# Patient Record
Sex: Male | Born: 1945 | Race: White | Hispanic: No | Marital: Married | Smoking: Never smoker
Health system: Southern US, Community
[De-identification: ages and names within clinical notes are randomized; demographics above are authoritative.]

## PROBLEM LIST (undated history)

## (undated) DIAGNOSIS — H269 Unspecified cataract: Secondary | ICD-10-CM

## (undated) DIAGNOSIS — K5790 Diverticulosis of intestine, part unspecified, without perforation or abscess without bleeding: Secondary | ICD-10-CM

## (undated) DIAGNOSIS — I10 Essential (primary) hypertension: Secondary | ICD-10-CM

## (undated) DIAGNOSIS — E785 Hyperlipidemia, unspecified: Secondary | ICD-10-CM

## (undated) DIAGNOSIS — H409 Unspecified glaucoma: Secondary | ICD-10-CM

## (undated) DIAGNOSIS — K649 Unspecified hemorrhoids: Secondary | ICD-10-CM

## (undated) DIAGNOSIS — E119 Type 2 diabetes mellitus without complications: Secondary | ICD-10-CM

## (undated) DIAGNOSIS — K219 Gastro-esophageal reflux disease without esophagitis: Secondary | ICD-10-CM

## (undated) DIAGNOSIS — N400 Enlarged prostate without lower urinary tract symptoms: Secondary | ICD-10-CM

## (undated) HISTORY — DX: Diverticulosis of intestine, part unspecified, without perforation or abscess without bleeding: K57.90

## (undated) HISTORY — DX: Unspecified hemorrhoids: K64.9

## (undated) HISTORY — DX: Type 2 diabetes mellitus without complications: E11.9

## (undated) HISTORY — DX: Benign prostatic hyperplasia without lower urinary tract symptoms: N40.0

## (undated) HISTORY — DX: Unspecified glaucoma: H40.9

## (undated) HISTORY — DX: Essential (primary) hypertension: I10

## (undated) HISTORY — PX: COLONOSCOPY: SHX174

## (undated) HISTORY — DX: Unspecified cataract: H26.9

## (undated) HISTORY — PX: POLYPECTOMY: SHX149

## (undated) HISTORY — DX: Hyperlipidemia, unspecified: E78.5

---

## 1994-11-16 DIAGNOSIS — I10 Essential (primary) hypertension: Secondary | ICD-10-CM | POA: Insufficient documentation

## 1994-11-16 DIAGNOSIS — N400 Enlarged prostate without lower urinary tract symptoms: Secondary | ICD-10-CM

## 1994-11-16 HISTORY — DX: Essential (primary) hypertension: I10

## 1994-11-16 HISTORY — DX: Benign prostatic hyperplasia without lower urinary tract symptoms: N40.0

## 1995-04-17 DIAGNOSIS — E785 Hyperlipidemia, unspecified: Secondary | ICD-10-CM

## 1995-04-17 HISTORY — DX: Hyperlipidemia, unspecified: E78.5

## 1998-06-16 ENCOUNTER — Encounter: Payer: Self-pay | Admitting: Family Medicine

## 1998-06-16 LAB — CONVERTED CEMR LAB: PSA: 1.6 ng/mL

## 1998-11-16 DIAGNOSIS — J301 Allergic rhinitis due to pollen: Secondary | ICD-10-CM

## 2001-01-14 DIAGNOSIS — K644 Residual hemorrhoidal skin tags: Secondary | ICD-10-CM

## 2001-03-16 ENCOUNTER — Encounter: Payer: Self-pay | Admitting: Family Medicine

## 2001-03-16 DIAGNOSIS — R739 Hyperglycemia, unspecified: Secondary | ICD-10-CM

## 2001-03-16 LAB — CONVERTED CEMR LAB: PSA: 0.6 ng/mL

## 2002-06-16 ENCOUNTER — Encounter: Payer: Self-pay | Admitting: Family Medicine

## 2002-06-16 LAB — CONVERTED CEMR LAB: PSA: 1.1 ng/mL

## 2004-01-15 ENCOUNTER — Encounter: Payer: Self-pay | Admitting: Family Medicine

## 2005-10-16 ENCOUNTER — Encounter: Payer: Self-pay | Admitting: Family Medicine

## 2005-10-22 ENCOUNTER — Ambulatory Visit: Payer: Self-pay | Admitting: Family Medicine

## 2005-11-11 ENCOUNTER — Ambulatory Visit: Payer: Self-pay | Admitting: Family Medicine

## 2005-11-26 ENCOUNTER — Ambulatory Visit: Payer: Self-pay | Admitting: Family Medicine

## 2006-02-10 ENCOUNTER — Ambulatory Visit: Payer: Self-pay | Admitting: Family Medicine

## 2006-06-28 ENCOUNTER — Ambulatory Visit: Payer: Self-pay | Admitting: Unknown Physician Specialty

## 2006-06-28 DIAGNOSIS — K5730 Diverticulosis of large intestine without perforation or abscess without bleeding: Secondary | ICD-10-CM | POA: Insufficient documentation

## 2006-12-22 ENCOUNTER — Ambulatory Visit: Payer: Self-pay | Admitting: Family Medicine

## 2007-02-16 ENCOUNTER — Ambulatory Visit: Payer: Self-pay | Admitting: Family Medicine

## 2007-02-16 LAB — CONVERTED CEMR LAB
AST: 22 units/L (ref 0–37)
Albumin: 4 g/dL (ref 3.5–5.2)
Alkaline Phosphatase: 47 units/L (ref 39–117)
BUN: 12 mg/dL (ref 6–23)
Calcium: 9.3 mg/dL (ref 8.4–10.5)
Chloride: 108 meq/L (ref 96–112)
Creatinine,U: 227.7 mg/dL
Direct LDL: 154.8 mg/dL
GFR calc non Af Amer: 66 mL/min
PSA: 1.46 ng/mL (ref 0.10–4.00)
Sodium: 142 meq/L (ref 135–145)
Total CHOL/HDL Ratio: 4.1
VLDL: 23 mg/dL (ref 0–40)

## 2007-02-18 ENCOUNTER — Ambulatory Visit: Payer: Self-pay | Admitting: Family Medicine

## 2007-08-22 ENCOUNTER — Ambulatory Visit: Payer: Self-pay | Admitting: Family Medicine

## 2007-08-22 LAB — CONVERTED CEMR LAB
ALT: 20 units/L (ref 0–53)
HDL: 56.9 mg/dL (ref >39.0)
PSA: 1.45 ng/mL (ref 0.10–4.00)
VLDL: 21 mg/dL (ref 0–40)

## 2007-08-24 ENCOUNTER — Ambulatory Visit: Payer: Self-pay | Admitting: Family Medicine

## 2008-03-29 ENCOUNTER — Ambulatory Visit: Payer: Self-pay | Admitting: Family Medicine

## 2008-03-29 LAB — CONVERTED CEMR LAB
Basophils Absolute: 0.1 10*3/uL (ref 0.0–0.1)
Bilirubin, Direct: 0.1 mg/dL (ref 0.0–0.3)
Calcium: 9 mg/dL (ref 8.4–10.5)
Cholesterol: 171 mg/dL (ref 0–200)
Eosinophils Absolute: 0.3 10*3/uL (ref 0.0–0.7)
GFR calc Af Amer: 111 mL/min
HCT: 32.2 % — ABNORMAL LOW (ref 39.0–52.0)
Hemoglobin: 10 g/dL — ABNORMAL LOW (ref 13.0–17.0)
MCHC: 31.2 g/dL (ref 30.0–36.0)
Microalb Creat Ratio: 38.6 mg/g — ABNORMAL HIGH (ref 0.0–30.0)
Microalb, Ur: 5.8 mg/dL — ABNORMAL HIGH (ref 0.0–1.9)
Monocytes Absolute: 0.6 10*3/uL (ref 0.1–1.0)
Neutro Abs: 3.3 10*3/uL (ref 1.4–7.7)
PSA: 1.13 ng/mL (ref 0.10–4.00)
RDW: 17.3 % — ABNORMAL HIGH (ref 11.5–14.6)
Sodium: 143 meq/L (ref 135–145)
TSH: 0.61 microintl units/mL (ref 0.35–5.50)
Total Bilirubin: 0.5 mg/dL (ref 0.3–1.2)
VLDL: 45 mg/dL — ABNORMAL HIGH (ref 0–40)

## 2008-04-02 ENCOUNTER — Ambulatory Visit: Payer: Self-pay | Admitting: Family Medicine

## 2008-04-02 DIAGNOSIS — D509 Iron deficiency anemia, unspecified: Secondary | ICD-10-CM | POA: Insufficient documentation

## 2008-04-02 DIAGNOSIS — N529 Male erectile dysfunction, unspecified: Secondary | ICD-10-CM

## 2008-06-08 ENCOUNTER — Ambulatory Visit: Payer: Self-pay | Admitting: Family Medicine

## 2008-06-08 LAB — FECAL OCCULT BLOOD, GUAIAC: Fecal Occult Blood: NEGATIVE

## 2008-06-11 ENCOUNTER — Encounter (INDEPENDENT_AMBULATORY_CARE_PROVIDER_SITE_OTHER): Payer: Self-pay | Admitting: *Deleted

## 2008-09-04 ENCOUNTER — Ambulatory Visit: Payer: Self-pay | Admitting: Family Medicine

## 2009-10-21 ENCOUNTER — Ambulatory Visit: Payer: Self-pay | Admitting: Family Medicine

## 2009-10-21 LAB — CONVERTED CEMR LAB
ALT: 34 units/L (ref 0–53)
Albumin: 4.1 g/dL (ref 3.5–5.2)
Alkaline Phosphatase: 53 units/L (ref 39–117)
Basophils Relative: 1 % (ref 0.0–3.0)
CO2: 28 meq/L (ref 19–32)
Calcium: 9.2 mg/dL (ref 8.4–10.5)
Creatinine,U: 103.1 mg/dL
Eosinophils Relative: 7 % — ABNORMAL HIGH (ref 0.0–5.0)
Glucose, Bld: 111 mg/dL — ABNORMAL HIGH (ref 70–99)
HCT: 44.1 % (ref 39.0–52.0)
HDL: 52.3 mg/dL (ref >39.00)
Hemoglobin: 15.1 g/dL (ref 13.0–17.0)
Lymphs Abs: 1.3 10*3/uL (ref 0.7–4.0)
Microalb Creat Ratio: 23.3 mg/g (ref 0.0–30.0)
Microalb, Ur: 2.4 mg/dL — ABNORMAL HIGH (ref 0.0–1.9)
Monocytes Relative: 10 % (ref 3.0–12.0)
Neutro Abs: 3.2 10*3/uL (ref 1.4–7.7)
PSA: 1.05 ng/mL (ref 0.10–4.00)
RBC: 4.35 M/uL (ref 4.22–5.81)
RDW: 12.4 % (ref 11.5–14.6)
Sodium: 140 meq/L (ref 135–145)
TSH: 1.2 microintl units/mL (ref 0.35–5.50)
Total Protein: 6.9 g/dL (ref 6.0–8.3)
WBC: 5.6 10*3/uL (ref 4.5–10.5)

## 2009-10-23 ENCOUNTER — Ambulatory Visit: Payer: Self-pay | Admitting: Family Medicine

## 2009-11-06 ENCOUNTER — Ambulatory Visit: Payer: Self-pay | Admitting: Family Medicine

## 2010-06-24 ENCOUNTER — Encounter (INDEPENDENT_AMBULATORY_CARE_PROVIDER_SITE_OTHER): Payer: Self-pay | Admitting: *Deleted

## 2010-12-17 NOTE — Letter (Signed)
Summary: Raymond Kirby letter  [place] at [facility]  [address] [place], [place] [address]   Phone: [phone]  Fax: [phone]       06/24/2010 MRN: [id number]  Raymond Kirby [address] [place], [place]  [address]  Dear Mr. MARSALIS,  [facility] [place], and [facility] announce the retirement of Raymond Kirby, M.D., from full-time practice at the [facility] office effective May 15, 2010 and his plans of returning part-time.  It is important to Raymond Kirby and to our practice that you understand that [facility] has seven physicians in our office for your health care needs.  We will continue to offer the same exceptional care that you have today.    Raymond Kirby has spoken to many of you about his plans for retirement and returning part-time in the fall.   We will continue to work with you through the transition to schedule appointments for you in the office and meet the high standards that [place] is committed to.   Again, it is with great pleasure that we share the news that Raymond Kirby will return to [facility] at [facility] in October of 2011 with a reduced schedule.    If you have any questions, or would like to request an appointment with one of our physicians, please call us at [phone] and press the option for Scheduling an appointment.  We take pleasure in providing you with excellent patient care and look forward to seeing you at your next office visit.  Our [facility] Physicians are:  Raymond Kirby, M.D. Raymond Kirby, M.D. Raymond Kirby, M.D. Raymond Kirby, M.D. Raymond Kirby, M.D. Raymond Kirby, M.D. We proudly welcomed Raymond Kirby, M.D. and Raymond Kirby, M.D. to the practice in July/August 2011.  Sincerely,   [facility]

## 2010-12-23 ENCOUNTER — Encounter: Payer: Self-pay | Admitting: Family Medicine

## 2011-02-04 ENCOUNTER — Other Ambulatory Visit: Payer: Self-pay

## 2011-02-06 ENCOUNTER — Other Ambulatory Visit: Payer: Self-pay | Admitting: Family Medicine

## 2011-02-06 DIAGNOSIS — D508 Other iron deficiency anemias: Secondary | ICD-10-CM

## 2011-02-06 DIAGNOSIS — N400 Enlarged prostate without lower urinary tract symptoms: Secondary | ICD-10-CM

## 2011-02-06 DIAGNOSIS — I10 Essential (primary) hypertension: Secondary | ICD-10-CM

## 2011-02-06 DIAGNOSIS — R7989 Other specified abnormal findings of blood chemistry: Secondary | ICD-10-CM

## 2011-02-06 DIAGNOSIS — E785 Hyperlipidemia, unspecified: Secondary | ICD-10-CM

## 2011-02-09 ENCOUNTER — Other Ambulatory Visit (INDEPENDENT_AMBULATORY_CARE_PROVIDER_SITE_OTHER): Payer: BC Managed Care – PPO | Admitting: Family Medicine

## 2011-02-09 DIAGNOSIS — R7989 Other specified abnormal findings of blood chemistry: Secondary | ICD-10-CM

## 2011-02-09 DIAGNOSIS — N400 Enlarged prostate without lower urinary tract symptoms: Secondary | ICD-10-CM

## 2011-02-09 DIAGNOSIS — D508 Other iron deficiency anemias: Secondary | ICD-10-CM

## 2011-02-09 DIAGNOSIS — E785 Hyperlipidemia, unspecified: Secondary | ICD-10-CM

## 2011-02-09 DIAGNOSIS — I10 Essential (primary) hypertension: Secondary | ICD-10-CM

## 2011-02-09 LAB — CBC WITH DIFFERENTIAL/PLATELET
Basophils Relative: 0.7 % (ref 0.0–3.0)
HCT: 43.1 % (ref 39.0–52.0)
Hemoglobin: 14.9 g/dL (ref 13.0–17.0)
Lymphocytes Relative: 19 % (ref 12.0–46.0)
MCHC: 34.6 g/dL (ref 30.0–36.0)
Monocytes Relative: 9.5 % (ref 3.0–12.0)
Neutro Abs: 3.8 10*3/uL (ref 1.4–7.7)
RBC: 4.37 Mil/uL (ref 4.22–5.81)

## 2011-02-09 LAB — HEPATIC FUNCTION PANEL
ALT: 39 U/L (ref 0–53)
Albumin: 4.3 g/dL (ref 3.5–5.2)
Bilirubin, Direct: 0.1 mg/dL (ref 0.0–0.3)
Total Protein: 7.1 g/dL (ref 6.0–8.3)

## 2011-02-09 LAB — BASIC METABOLIC PANEL
Calcium: 9.4 mg/dL (ref 8.4–10.5)
GFR: 95.35 mL/min (ref >60.00)
Sodium: 137 mEq/L (ref 135–145)

## 2011-02-09 LAB — MICROALBUMIN / CREATININE URINE RATIO
Creatinine,U: 289.1 mg/dL
Microalb Creat Ratio: 4.3 mg/g (ref 0.0–30.0)

## 2011-02-09 LAB — TSH: TSH: 1.11 u[IU]/mL (ref 0.35–5.50)

## 2011-02-09 LAB — LIPID PANEL: Triglycerides: 217 mg/dL — ABNORMAL HIGH (ref 0.0–149.0)

## 2011-02-09 LAB — PSA: PSA: 0.78 ng/mL (ref 0.10–4.00)

## 2011-02-11 ENCOUNTER — Ambulatory Visit (INDEPENDENT_AMBULATORY_CARE_PROVIDER_SITE_OTHER): Payer: BC Managed Care – PPO | Admitting: Family Medicine

## 2011-02-11 ENCOUNTER — Encounter: Payer: Self-pay | Admitting: Family Medicine

## 2011-02-11 DIAGNOSIS — N400 Enlarged prostate without lower urinary tract symptoms: Secondary | ICD-10-CM

## 2011-02-11 DIAGNOSIS — I10 Essential (primary) hypertension: Secondary | ICD-10-CM

## 2011-02-11 DIAGNOSIS — K645 Perianal venous thrombosis: Secondary | ICD-10-CM

## 2011-02-11 DIAGNOSIS — D508 Other iron deficiency anemias: Secondary | ICD-10-CM

## 2011-02-11 DIAGNOSIS — N529 Male erectile dysfunction, unspecified: Secondary | ICD-10-CM

## 2011-02-11 DIAGNOSIS — L309 Dermatitis, unspecified: Secondary | ICD-10-CM

## 2011-02-11 DIAGNOSIS — K219 Gastro-esophageal reflux disease without esophagitis: Secondary | ICD-10-CM

## 2011-02-11 DIAGNOSIS — R7989 Other specified abnormal findings of blood chemistry: Secondary | ICD-10-CM

## 2011-02-11 DIAGNOSIS — L259 Unspecified contact dermatitis, unspecified cause: Secondary | ICD-10-CM

## 2011-02-11 DIAGNOSIS — E785 Hyperlipidemia, unspecified: Secondary | ICD-10-CM

## 2011-02-11 MED ORDER — FINASTERIDE 5 MG PO TABS: 5.0000 mg | ORAL_TABLET | Freq: Every day | ORAL | Status: DC

## 2011-02-11 MED ORDER — OMEPRAZOLE 20 MG PO CPDR: 20.0000 mg | DELAYED_RELEASE_CAPSULE | Freq: Every day | ORAL | Status: DC

## 2011-02-11 MED ORDER — HYDROCORTISONE ACETATE 25 MG RE SUPP: 25.0000 mg | Freq: Two times a day (BID) | RECTAL | Status: DC

## 2011-02-11 MED ORDER — METOPROLOL SUCCINATE ER 50 MG PO TB24: 100.0000 mg | ORAL_TABLET | Freq: Every day | ORAL | Status: DC

## 2011-02-11 MED ORDER — ATORVASTATIN CALCIUM 80 MG PO TABS: 80.0000 mg | ORAL_TABLET | Freq: Every day | ORAL | Status: DC

## 2011-02-11 MED ORDER — CLOBETASOL PROP EMOLLIENT BASE 0.05 % EX CREA: TOPICAL_CREAM | CUTANEOUS | Status: DC

## 2011-02-11 NOTE — Patient Instructions (Signed)
RTC 3 mos for BP check.

## 2011-02-11 NOTE — Assessment & Plan Note (Addendum)
High. Will increase Metoprolol and recheck in about three months. BP Readings from Last 3 Encounters:  02/11/11 158/90  11/06/09 130/84  10/23/09 148/80

## 2011-02-11 NOTE — Assessment & Plan Note (Signed)
Viagra too expensive. Is dealing with sxs.

## 2011-02-11 NOTE — Assessment & Plan Note (Signed)
Improved

## 2011-02-11 NOTE — Assessment & Plan Note (Signed)
Sugar still slightly high. Discussed avoiding sweets and carbs and losing weight.

## 2011-02-11 NOTE — Assessment & Plan Note (Signed)
Is satisfied with current results with Proscar. Cont.

## 2011-02-11 NOTE — Assessment & Plan Note (Signed)
Distended today. Try Anusol HC Supps and use sitz bathing. Discussed cleaning with water after each BM as well.

## 2011-02-11 NOTE — Progress Notes (Signed)
  Subjective:    Patient ID: Raymond Kirby, male    DOB: Nov 09, 1946, 65 y.o.   MRN: [id number]  HPI Pt here for Comp Exam. His hemms still bother him. He takes fiber well. Otherwise he is doing well.    Review of Systems  Constitutional: Negative for fever, chills, diaphoresis, appetite change, fatigue and unexpected weight change.  HENT: Negative for hearing loss, ear pain, tinnitus and ear discharge.   Eyes: Negative for pain, discharge and visual disturbance.  Respiratory: Positive for cough (occas, poss due to GERD.). Negative for shortness of breath and wheezing.   Cardiovascular: Negative for chest pain and palpitations.       [No SOB w/ exertion Gastrointestinal: Positive for constipation and blood in stool. Negative for nausea, vomiting, abdominal pain and diarrhea.       [Occas  Heartburn, no  swallowing problems. Genitourinary: Negative for dysuria, frequency and difficulty urinating.       [No nocturia Musculoskeletal: Negative for myalgias, back pain and arthralgias.  Skin: Rash: of lower legs.        [No itching or dryness. Neurological: Negative for tremors and numbness.       [No tingling or balance problems. Hematological: Negative for adenopathy. Does not bruise/bleed easily.  Psychiatric/Behavioral: Negative for dysphoric mood and agitation.       Objective:   Physical Exam  Constitutional: He is oriented to person, place, and time. He appears well-developed and well-nourished. No distress.  HENT:  Head: Normocephalic and atraumatic.  Right Ear: External ear normal.  Left Ear: External ear normal.  Nose: Nose normal.  Mouth/Throat: Oropharynx is clear and moist.  Eyes: Conjunctivae and EOM are normal. Pupils are equal, round, and reactive to light. Right eye exhibits no discharge. Left eye exhibits no discharge. No scleral icterus.  Neck: Normal range of motion. Neck supple. No thyromegaly present.  Cardiovascular: Normal rate, regular rhythm, normal heart  sounds and intact distal pulses.   No murmur heard. Pulmonary/Chest: Effort normal and breath sounds normal. No respiratory distress. He has no wheezes.  Abdominal: Soft. Bowel sounds are normal. He exhibits no distension and no mass. There is no tenderness. There is no rebound and no guarding.  Genitourinary: Prostate normal and penis normal. Guaiac positive stool.       Mildly distended left sided ext hemms with mild blood on rectal exam. Prostate 30gms.   Musculoskeletal: Normal range of motion. He exhibits no edema.  Lymphadenopathy:    He has no cervical adenopathy.  Neurological: He is alert and oriented to person, place, and time. Coordination normal.  Skin: Skin is warm and dry. No rash noted. He is not diaphoretic.       Mildly erythematous maculopapular rash around the ankles, bilat. 3 distinct small bites of the right lateral ankle, look clean and healing.  Psychiatric: He has a normal mood and affect. His behavior is normal. Judgment and thought content normal.          Assessment & Plan:  Physical Exam

## 2011-02-11 NOTE — Assessment & Plan Note (Signed)
Acceptable but try to decrease Sweets and carbs and fatty foods. Weight loss will help.

## 2012-02-19 ENCOUNTER — Other Ambulatory Visit: Payer: Self-pay | Admitting: *Deleted

## 2012-02-19 DIAGNOSIS — N400 Enlarged prostate without lower urinary tract symptoms: Secondary | ICD-10-CM

## 2012-02-19 DIAGNOSIS — E785 Hyperlipidemia, unspecified: Secondary | ICD-10-CM

## 2012-02-19 MED ORDER — FINASTERIDE 5 MG PO TABS: 5.0000 mg | ORAL_TABLET | Freq: Every day | ORAL | Status: DC

## 2012-02-19 MED ORDER — ATORVASTATIN CALCIUM 80 MG PO TABS: 80.0000 mg | ORAL_TABLET | Freq: Every day | ORAL | Status: DC

## 2012-02-19 MED ORDER — METOPROLOL SUCCINATE ER 50 MG PO TB24: 100.0000 mg | ORAL_TABLET | Freq: Every day | ORAL | Status: DC

## 2012-02-19 NOTE — Telephone Encounter (Signed)
Ok to refill?  Patient has an upcoming appt with Dr. Para March.

## 2012-02-19 NOTE — Telephone Encounter (Signed)
Sent!

## 2012-05-01 ENCOUNTER — Other Ambulatory Visit: Payer: Self-pay | Admitting: Family Medicine

## 2012-05-01 DIAGNOSIS — D649 Anemia, unspecified: Secondary | ICD-10-CM

## 2012-05-01 DIAGNOSIS — N400 Enlarged prostate without lower urinary tract symptoms: Secondary | ICD-10-CM

## 2012-05-01 DIAGNOSIS — I10 Essential (primary) hypertension: Secondary | ICD-10-CM

## 2012-05-04 ENCOUNTER — Other Ambulatory Visit: Payer: BC Managed Care – PPO

## 2012-05-10 ENCOUNTER — Other Ambulatory Visit (INDEPENDENT_AMBULATORY_CARE_PROVIDER_SITE_OTHER): Payer: BC Managed Care – PPO

## 2012-05-10 DIAGNOSIS — D649 Anemia, unspecified: Secondary | ICD-10-CM

## 2012-05-10 DIAGNOSIS — I10 Essential (primary) hypertension: Secondary | ICD-10-CM

## 2012-05-10 DIAGNOSIS — N400 Enlarged prostate without lower urinary tract symptoms: Secondary | ICD-10-CM

## 2012-05-10 LAB — CBC WITH DIFFERENTIAL/PLATELET
Basophils Absolute: 0.1 10*3/uL (ref 0.0–0.1)
Eosinophils Relative: 6.2 % — ABNORMAL HIGH (ref 0.0–5.0)
Lymphocytes Relative: 20.2 % (ref 12.0–46.0)
Monocytes Relative: 11.3 % (ref 3.0–12.0)
Platelets: 217 10*3/uL (ref 150.0–400.0)
RDW: 17.1 % — ABNORMAL HIGH (ref 11.5–14.6)
WBC: 6.5 10*3/uL (ref 4.5–10.5)

## 2012-05-10 LAB — COMPREHENSIVE METABOLIC PANEL
ALT: 35 U/L (ref 0–53)
Albumin: 4.1 g/dL (ref 3.5–5.2)
CO2: 27 mEq/L (ref 19–32)
Calcium: 9.9 mg/dL (ref 8.4–10.5)
Chloride: 102 mEq/L (ref 96–112)
GFR: 81.68 mL/min (ref >60.00)
Glucose, Bld: 113 mg/dL — ABNORMAL HIGH (ref 70–99)
Potassium: 4.2 mEq/L (ref 3.5–5.1)
Sodium: 141 mEq/L (ref 135–145)
Total Protein: 7 g/dL (ref 6.0–8.3)

## 2012-05-10 LAB — LIPID PANEL
HDL: 56.8 mg/dL (ref >39.00)
LDL Cholesterol: 85 mg/dL (ref 0–99)
Total CHOL/HDL Ratio: 3
Triglycerides: 134 mg/dL (ref 0.0–149.0)

## 2012-05-11 ENCOUNTER — Encounter: Payer: BC Managed Care – PPO | Admitting: Family Medicine

## 2012-05-12 ENCOUNTER — Encounter: Payer: Self-pay | Admitting: Family Medicine

## 2012-05-12 ENCOUNTER — Ambulatory Visit (INDEPENDENT_AMBULATORY_CARE_PROVIDER_SITE_OTHER): Payer: BC Managed Care – PPO | Admitting: Family Medicine

## 2012-05-12 VITALS — BP 170/92 | HR 63 | Temp 98.4°F | Wt 212.0 lb

## 2012-05-12 DIAGNOSIS — Z0000 Encounter for general adult medical examination without abnormal findings: Secondary | ICD-10-CM | POA: Insufficient documentation

## 2012-05-12 DIAGNOSIS — E785 Hyperlipidemia, unspecified: Secondary | ICD-10-CM

## 2012-05-12 DIAGNOSIS — K644 Residual hemorrhoidal skin tags: Secondary | ICD-10-CM

## 2012-05-12 DIAGNOSIS — R7989 Other specified abnormal findings of blood chemistry: Secondary | ICD-10-CM

## 2012-05-12 DIAGNOSIS — N400 Enlarged prostate without lower urinary tract symptoms: Secondary | ICD-10-CM

## 2012-05-12 DIAGNOSIS — K645 Perianal venous thrombosis: Secondary | ICD-10-CM

## 2012-05-12 DIAGNOSIS — I10 Essential (primary) hypertension: Secondary | ICD-10-CM

## 2012-05-12 DIAGNOSIS — K219 Gastro-esophageal reflux disease without esophagitis: Secondary | ICD-10-CM

## 2012-05-12 MED ORDER — OMEPRAZOLE 20 MG PO CPDR: 20.0000 mg | DELAYED_RELEASE_CAPSULE | Freq: Every day | ORAL | Status: DC

## 2012-05-12 MED ORDER — ATORVASTATIN CALCIUM 80 MG PO TABS: 80.0000 mg | ORAL_TABLET | Freq: Every day | ORAL | Status: DC

## 2012-05-12 MED ORDER — METOPROLOL SUCCINATE ER 100 MG PO TB24: 100.0000 mg | ORAL_TABLET | Freq: Every day | ORAL | Status: DC

## 2012-05-12 MED ORDER — FINASTERIDE 5 MG PO TABS: 5.0000 mg | ORAL_TABLET | Freq: Every day | ORAL | Status: DC

## 2012-05-12 MED ORDER — HYDROCORTISONE ACETATE 25 MG RE SUPP: 25.0000 mg | Freq: Two times a day (BID) | RECTAL | Status: DC

## 2012-05-12 NOTE — Assessment & Plan Note (Signed)
anusol prn 

## 2012-05-12 NOTE — Assessment & Plan Note (Signed)
He'll check BP out of clinic, notify me with results.

## 2012-05-12 NOTE — Patient Instructions (Addendum)
Check your pressure at home and let me know if it is >140/90 consistently.  I would get a flu shot each fall.   Take care.  Work on your [employer].

## 2012-05-12 NOTE — Assessment & Plan Note (Signed)
D/w pt about weight loss. Recheck yearly.  Diet handout given.

## 2012-05-12 NOTE — Assessment & Plan Note (Signed)
PSA not elevated, continue current meds.

## 2012-05-12 NOTE — Assessment & Plan Note (Signed)
Controlled, continue current meds, needs to lose weight.

## 2012-05-12 NOTE — Progress Notes (Signed)
Patient requests new Rx for Anusol HC to use prn.  L. Anjela Cassara, CMA  CPE- See plan.  Routine anticipatory guidance given to patient.  See health maintenance. Colonoscopy 2007 PSA normal.  Shingles shot 2009 tetanus 2006 Flu shot done yearly PNA due at 66 Has a living will.    BPH.  On finasteride.  Stream is 'fair'.  PSA wnl.  No pain with urination.    Mild hyperglycemia. D/w pt.  See plan .  Hypertension:    Using medication without problems or lightheadedness: yes Chest pain with exertion:no Edema:no Short of breath:no Average home BPs: not checked.  He'll check out of clinic.   Elevated Cholesterol: Using medications without problems:yes Muscle aches: no Diet compliance: "not bad", discussed.  Exercise: "not much", discussed  PMH and SH reviewed  Meds, vitals, and allergies reviewed.   ROS: See HPI.  Otherwise negative.    GEN: nad, alert and oriented HEENT: mucous membranes moist NECK: supple w/o LA CV: rrr. PULM: ctab, no inc wob ABD: soft, +bs EXT: no edema SKIN: no acute rash Prostate gland firm and smooth, symmetric enlargement, but no nodularity, tenderness, mass, asymmetry or induration. Ext hemorrhoids noted.

## 2012-08-18 ENCOUNTER — Telehealth: Payer: Self-pay

## 2012-08-18 NOTE — Telephone Encounter (Signed)
Pt has periodically monitored BP at home ranging 130-150 / 70-80's. On 08/16/12 pt took BP 170/92 rechecked 133/84 and 5 min later recked 113/86. Not sure if BP device is accurate. Pt wants to know if needs to come in for appt to change BP med or can med be called to [facility]. Pt has had no h/a, dizziness or chest pain.Please advise.

## 2012-08-18 NOTE — Telephone Encounter (Signed)
Patient advised.

## 2012-08-18 NOTE — Telephone Encounter (Signed)
Have him calibrate his BP cuff against another cuff.  If readings are similar, then his cuff should be fine.  If that is the case, and with a majority of his BP readings being acceptable, then I wouldn't change his meds.  Thanks.

## 2012-09-30 ENCOUNTER — Encounter: Payer: Self-pay | Admitting: Family Medicine

## 2012-09-30 ENCOUNTER — Ambulatory Visit (INDEPENDENT_AMBULATORY_CARE_PROVIDER_SITE_OTHER): Payer: Medicare Other | Admitting: Family Medicine

## 2012-09-30 VITALS — BP 160/80 | HR 93 | Temp 99.4°F | Wt 213.0 lb

## 2012-09-30 DIAGNOSIS — R05 Cough: Secondary | ICD-10-CM

## 2012-09-30 MED ORDER — HYDROCODONE-HOMATROPINE 5-1.5 MG/5ML PO SYRP: 5.0000 mL | ORAL_SOLUTION | Freq: Three times a day (TID) | ORAL | Status: DC | PRN

## 2012-09-30 NOTE — Patient Instructions (Addendum)
Use the cough medicine as needed, sedation caution.  Drink plenty of fluids, take tylenol as needed, and gargle with warm salt water for your throat.  This should gradually improve.  Take care.  Let us know if you have other concerns.

## 2012-09-30 NOTE — Progress Notes (Signed)
2 days of symptoms.  Started taking guaifenesin and dextromethorphan w/o much relief.  Sputum production.  occ wheeze, prev.  Cough worse laying down at night.  Taking tylenol.  Temp 99.4 here.  HA but no myalgias.  Mild ST, with cough. No ear pain.  Some rhinorrhea.  Mult sick contacts.    Meds, vitals, and allergies reviewed.   ROS: See HPI.  Otherwise, noncontributory.  GEN: nad, alert and oriented HEENT: mucous membranes moist, tm w/o erythema, nasal exam w/o erythema, clear discharge noted,  OP with cobblestoning NECK: supple w/o LA CV: rrr.   PULM: ctab, no inc wob EXT: no edema SKIN: no acute rash

## 2012-10-01 DIAGNOSIS — R05 Cough: Secondary | ICD-10-CM | POA: Insufficient documentation

## 2012-10-01 NOTE — Assessment & Plan Note (Signed)
Nontoxic, with lungs completely ctab and w/o focal dec in BS.  Likely viral, no indication for abx. Would use hycodan prn cough and f/u prn. Supportive care o/w.

## 2013-02-22 ENCOUNTER — Other Ambulatory Visit: Payer: Self-pay | Admitting: *Deleted

## 2013-02-22 DIAGNOSIS — N400 Enlarged prostate without lower urinary tract symptoms: Secondary | ICD-10-CM

## 2013-02-22 MED ORDER — FINASTERIDE 5 MG PO TABS: 5.0000 mg | ORAL_TABLET | Freq: Every day | ORAL | Status: DC

## 2013-02-22 MED ORDER — METOPROLOL SUCCINATE ER 100 MG PO TB24: 100.0000 mg | ORAL_TABLET | Freq: Every day | ORAL | Status: DC

## 2013-02-22 NOTE — Addendum Note (Signed)
Addended by: Baldomero Lamy on: 02/22/2013 09:53 AM   Modules accepted: Orders

## 2013-05-08 ENCOUNTER — Other Ambulatory Visit: Payer: Self-pay | Admitting: *Deleted

## 2013-05-08 DIAGNOSIS — K219 Gastro-esophageal reflux disease without esophagitis: Secondary | ICD-10-CM

## 2013-05-08 MED ORDER — OMEPRAZOLE 20 MG PO CPDR: 20.0000 mg | DELAYED_RELEASE_CAPSULE | Freq: Every day | ORAL | Status: DC

## 2013-05-11 ENCOUNTER — Other Ambulatory Visit: Payer: Self-pay | Admitting: Family Medicine

## 2013-05-11 DIAGNOSIS — E785 Hyperlipidemia, unspecified: Secondary | ICD-10-CM

## 2013-05-11 DIAGNOSIS — N400 Enlarged prostate without lower urinary tract symptoms: Secondary | ICD-10-CM

## 2013-05-11 MED ORDER — FINASTERIDE 5 MG PO TABS: 5.0000 mg | ORAL_TABLET | Freq: Every day | ORAL | Status: DC

## 2013-05-11 MED ORDER — ATORVASTATIN CALCIUM 80 MG PO TABS: 80.0000 mg | ORAL_TABLET | Freq: Every day | ORAL | Status: DC

## 2013-05-11 MED ORDER — METOPROLOL SUCCINATE ER 100 MG PO TB24: 100.0000 mg | ORAL_TABLET | Freq: Every day | ORAL | Status: DC

## 2013-06-28 ENCOUNTER — Other Ambulatory Visit: Payer: Self-pay | Admitting: Family Medicine

## 2013-06-28 DIAGNOSIS — E785 Hyperlipidemia, unspecified: Secondary | ICD-10-CM

## 2013-06-28 MED ORDER — ATORVASTATIN CALCIUM 80 MG PO TABS: 80.0000 mg | ORAL_TABLET | Freq: Every day | ORAL | Status: DC

## 2013-08-06 ENCOUNTER — Other Ambulatory Visit: Payer: Self-pay | Admitting: Family Medicine

## 2013-08-06 DIAGNOSIS — I10 Essential (primary) hypertension: Secondary | ICD-10-CM

## 2013-08-06 DIAGNOSIS — N400 Enlarged prostate without lower urinary tract symptoms: Secondary | ICD-10-CM

## 2013-08-10 ENCOUNTER — Other Ambulatory Visit (INDEPENDENT_AMBULATORY_CARE_PROVIDER_SITE_OTHER): Payer: Medicare Other

## 2013-08-10 DIAGNOSIS — N400 Enlarged prostate without lower urinary tract symptoms: Secondary | ICD-10-CM

## 2013-08-10 DIAGNOSIS — Z0000 Encounter for general adult medical examination without abnormal findings: Secondary | ICD-10-CM

## 2013-08-10 DIAGNOSIS — D508 Other iron deficiency anemias: Secondary | ICD-10-CM

## 2013-08-10 DIAGNOSIS — R7989 Other specified abnormal findings of blood chemistry: Secondary | ICD-10-CM

## 2013-08-10 DIAGNOSIS — I10 Essential (primary) hypertension: Secondary | ICD-10-CM

## 2013-08-10 DIAGNOSIS — E785 Hyperlipidemia, unspecified: Secondary | ICD-10-CM

## 2013-08-11 LAB — COMPREHENSIVE METABOLIC PANEL
ALT: 24 U/L (ref 0–53)
AST: 20 U/L (ref 0–37)
CO2: 27 mEq/L (ref 19–32)
Creatinine, Ser: 1 mg/dL (ref 0.4–1.5)
GFR: 83.32 mL/min (ref >60.00)
Sodium: 139 mEq/L (ref 135–145)
Total Bilirubin: 0.6 mg/dL (ref 0.3–1.2)
Total Protein: 6.9 g/dL (ref 6.0–8.3)

## 2013-08-11 LAB — LIPID PANEL
HDL: 51.3 mg/dL (ref >39.00)
LDL Cholesterol: 93 mg/dL (ref 0–99)
Total CHOL/HDL Ratio: 3
VLDL: 31 mg/dL (ref 0.0–40.0)

## 2013-08-11 LAB — PSA, MEDICARE: PSA: 0.65 ng/ml (ref 0.10–4.00)

## 2013-08-15 ENCOUNTER — Other Ambulatory Visit: Payer: Medicare Other

## 2013-08-18 ENCOUNTER — Ambulatory Visit (INDEPENDENT_AMBULATORY_CARE_PROVIDER_SITE_OTHER): Payer: Medicare Other | Admitting: Family Medicine

## 2013-08-18 ENCOUNTER — Encounter: Payer: Self-pay | Admitting: Family Medicine

## 2013-08-18 VITALS — BP 150/90 | HR 68 | Temp 98.3°F | Ht 70.0 in | Wt 219.5 lb

## 2013-08-18 DIAGNOSIS — N400 Enlarged prostate without lower urinary tract symptoms: Secondary | ICD-10-CM

## 2013-08-18 DIAGNOSIS — I10 Essential (primary) hypertension: Secondary | ICD-10-CM

## 2013-08-18 DIAGNOSIS — E785 Hyperlipidemia, unspecified: Secondary | ICD-10-CM

## 2013-08-18 DIAGNOSIS — Z0000 Encounter for general adult medical examination without abnormal findings: Secondary | ICD-10-CM

## 2013-08-18 DIAGNOSIS — R7989 Other specified abnormal findings of blood chemistry: Secondary | ICD-10-CM

## 2013-08-18 DIAGNOSIS — K219 Gastro-esophageal reflux disease without esophagitis: Secondary | ICD-10-CM

## 2013-08-18 DIAGNOSIS — Z23 Encounter for immunization: Secondary | ICD-10-CM

## 2013-08-18 MED ORDER — METOPROLOL SUCCINATE ER 100 MG PO TB24: 100.0000 mg | ORAL_TABLET | Freq: Every day | ORAL | Status: DC

## 2013-08-18 MED ORDER — OMEPRAZOLE 20 MG PO CPDR: 20.0000 mg | DELAYED_RELEASE_CAPSULE | Freq: Every day | ORAL | Status: DC

## 2013-08-18 MED ORDER — ATORVASTATIN CALCIUM 80 MG PO TABS: 80.0000 mg | ORAL_TABLET | Freq: Every day | ORAL | Status: DC

## 2013-08-18 MED ORDER — FINASTERIDE 5 MG PO TABS: 5.0000 mg | ORAL_TABLET | Freq: Every day | ORAL | Status: DC

## 2013-08-18 NOTE — Progress Notes (Signed)
I have personally reviewed the Medicare Annual Wellness questionnaire and have noted 1. The patient's medical and social history 2. Their use of alcohol, tobacco or illicit drugs 3. Their current medications and supplements 4. The patient's functional ability including ADL's, fall risks, home safety risks and hearing or visual             impairment. 5. Diet and physical activities 6. Evidence for depression or mood disorders  The patients weight, height, BMI have been recorded in the chart and visual acuity is per eye clinic.  I have made referrals, counseling and provided education to the patient based review of the above and I have provided the pt with a written personalized care plan for preventive services.  See scanned forms.  Routine anticipatory guidance given to patient.  See health maintenance. Flu  2014 Shingles 2009 PNA 2014 Tetanus 2006 Colonoscopy 2007 Prostate cancer screening 2014 Advance directive d/w pt, wife designated.  Cognitive function addressed- see scanned forms- and if abnormal then additional documentation follows.   BPH.  PSA wnl.  LUTS minimal. No ADE on med.   Elevated Cholesterol: Using medications without problems:yes Muscle aches: no Diet compliance:discussed Exercise:discussed  Hypertension:    Using medication without problems or lightheadedness: yes Chest pain with exertion:no Edema:no Short of breath:no  GERD controlled with PPI, doing well, no ADE.    PMH and SH reviewed  Meds, vitals, and allergies reviewed.   ROS: See HPI.  Otherwise negative.    GEN: nad, alert and oriented HEENT: mucous membranes moist NECK: supple w/o LA CV: rrr. PULM: ctab, no inc wob ABD: soft, +bs EXT: no edema SKIN: no acute rash

## 2013-08-18 NOTE — Patient Instructions (Addendum)
Check your BP a few times and let me know if it is consistently >140/>90.  Take care.  Glad to see you.  Recheck in 1 year.   Try to work on getting more exercise.

## 2013-08-19 NOTE — Assessment & Plan Note (Signed)
See scanned forms.  Routine anticipatory guidance given to patient.  See health maintenance. Flu  2014 Shingles 2009 PNA 2014 Tetanus 2006 Colonoscopy 2007 Prostate cancer screening 2014 Advance directive d/w pt, wife designated.  Cognitive function addressed- see scanned forms- and if abnormal then additional documentation follows.

## 2013-08-19 NOTE — Assessment & Plan Note (Signed)
Encouraged diet and exercise.  

## 2013-08-19 NOTE — Assessment & Plan Note (Signed)
Continue meds, work on diet and exercise.  Labs d/w pt.

## 2013-08-19 NOTE — Assessment & Plan Note (Signed)
Continue meds, work on diet and exercise.  Labs d/w pt.  He'll check BP out of clinic.

## 2013-08-19 NOTE — Assessment & Plan Note (Signed)
Controlled LUTS, fu prn.  PSA wnl.

## 2013-11-24 ENCOUNTER — Ambulatory Visit (INDEPENDENT_AMBULATORY_CARE_PROVIDER_SITE_OTHER): Payer: Medicare Other | Admitting: Internal Medicine

## 2013-11-24 ENCOUNTER — Encounter: Payer: Self-pay | Admitting: Internal Medicine

## 2013-11-24 VITALS — BP 126/82 | HR 77 | Temp 99.2°F | Wt 210.5 lb

## 2013-11-24 DIAGNOSIS — J209 Acute bronchitis, unspecified: Secondary | ICD-10-CM

## 2013-11-24 MED ORDER — AZITHROMYCIN 250 MG PO TABS: ORAL_TABLET | ORAL | Status: DC

## 2013-11-24 MED ORDER — HYDROCODONE-HOMATROPINE 5-1.5 MG/5ML PO SYRP: 5.0000 mL | ORAL_SOLUTION | Freq: Three times a day (TID) | ORAL | Status: DC | PRN

## 2013-11-24 NOTE — Progress Notes (Signed)
Pre-visit discussion using our clinic review tool. No additional management support is needed unless otherwise documented below in the visit note.  

## 2013-11-24 NOTE — Progress Notes (Signed)
HPI  Pt presents to the clinic today with c/o cough and chest congestion. This started 3 weeks ago. The cough is productive of dark yellow mucous. He coughs all day and all night. He has also been running fevers. He has been taking Mucinex and Robitussin. He did get his flu shot. He has no history of asthma or breathing problems. He has had sick contacts.  Review of Systems      Past Medical History  Diagnosis Date  . Hypertension 11/16/1994  . Hyperlipemia 04/17/1995  . BPH (benign prostatic hypertrophy) 11/16/1994  . Diverticulosis     Family History  Problem Relation Age of Onset  . Cancer Mother     breast CA Mets, recurrent  . Hypertension Father   . Cancer Father     prostate cancer  . Heart disease Father     MI,CABG  . Aneurysm Father   . Prostate cancer Father   . Stroke Father   . Cancer Sister     breast ca  . Colon cancer Neg Hx     History   Social History  . Marital Status: Married    Spouse Name: N/A    Number of Children: 2  . Years of Education: N/A   Occupational History  . Photographer    Social History Main Topics  . Smoking status: Never Smoker   . Smokeless tobacco: Not on file  . Alcohol Use: Yes     Comment: 2 drinks a day  . Drug Use: No  . Sexual Activity: Not on file   Other Topics Concern  . 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 Denies abrupt weight changes.  HEENT:  Positive sore throat. Denies eye redness, eye pain, pressure behind the eyes, facial pain, nasal congestion, ear pain, ringing in the ears, wax buildup, runny nose or bloody nose. Respiratory: Positive cough. Denies difficulty breathing or shortness of breath.  Cardiovascular: Denies chest pain, chest tightness, palpitations or swelling in the hands or feet.   No other specific complaints in a complete review of systems (except as  listed in HPI above).  Objective:   BP 126/82  Pulse 77  Temp(Src) 99.2 F (37.3 C) (Oral)  Wt 210 lb 8 oz (95.482 kg)  SpO2 98% Wt Readings from Last 3 Encounters:  11/24/13 210 lb 8 oz (95.482 kg)  08/18/13 219 lb 8 oz (99.565 kg)  09/30/12 213 lb (96.616 kg)     General: Appears his stated age, well developed, well nourished in NAD. HEENT: Head: normal shape and size; Eyes: sclera white, no icterus, conjunctiva pink, PERRLA and EOMs intact; Ears: Tm's gray and intact, normal light reflex; Nose: mucosa pink and moist, septum midline; Throat/Mouth: + PND. Teeth present, mucosa erythematous and moist, no exudate noted, no lesions or ulcerations noted.  Neck: Mild cervical lymphadenopathy. Neck supple, trachea midline. No massses, lumps or thyromegaly present.  Cardiovascular: Normal rate and rhythm. S1,S2 noted.  No murmur, rubs or gallops noted. No JVD or BLE edema. No carotid bruits noted. Pulmonary/Chest: Normal effort and scattered rhonchi noted throughout. No respiratory distress. No wheezes, rales or ronchi noted.      Assessment & Plan:   Acute Bronchitis:  Get some rest and drink plenty of water Do salt water gargles for the sore throat Give fever and duration of symptoms,  will give eRx for Azithromax x 5 days Rx for Hycodan cough syrup  RTC as needed or if symptoms persist.

## 2013-11-24 NOTE — Patient Instructions (Signed)
Acute Bronchitis Bronchitis is inflammation of the airways that extend from the windpipe into the lungs (bronchi). The inflammation often causes mucus to develop. This leads to a cough, which is the most common symptom of bronchitis.  In acute bronchitis, the condition usually develops suddenly and goes away over time, usually in a couple weeks. Smoking, allergies, and asthma can make bronchitis worse. Repeated episodes of bronchitis may cause further lung problems.  CAUSES Acute bronchitis is most often caused by the same virus that causes a cold. The virus can spread from person to person (contagious).  SIGNS AND SYMPTOMS   Cough.   Fever.   Coughing up mucus.   Body aches.   Chest congestion.   Chills.   Shortness of breath.   Sore throat.  DIAGNOSIS  Acute bronchitis is usually diagnosed through a physical exam. Tests, such as chest X-rays, are sometimes done to rule out other conditions.  TREATMENT  Acute bronchitis usually goes away in a couple weeks. Often times, no medical treatment is necessary. Medicines are sometimes given for relief of fever or cough. Antibiotics are usually not needed but may be prescribed in certain situations. In some cases, an inhaler may be recommended to help reduce shortness of breath and control the cough. A cool mist vaporizer may also be used to help thin bronchial secretions and make it easier to clear the chest.  HOME CARE INSTRUCTIONS  Get plenty of rest.   Drink enough fluids to keep your urine clear or pale yellow (unless you have a medical condition that requires fluid restriction). Increasing fluids may help thin your secretions and will prevent dehydration.   Only take over-the-counter or prescription medicines as directed by your health care provider.   Avoid smoking and secondhand smoke. Exposure to cigarette smoke or irritating chemicals will make bronchitis worse. If you are a smoker, consider using nicotine gum or skin  patches to help control withdrawal symptoms. Quitting smoking will help your lungs heal faster.   Reduce the chances of another bout of acute bronchitis by washing your hands frequently, avoiding people with cold symptoms, and trying not to touch your hands to your mouth, nose, or eyes.   Follow up with your health care provider as directed.  SEEK MEDICAL CARE IF: Your symptoms do not improve after 1 week of treatment.  SEEK IMMEDIATE MEDICAL CARE IF:  You develop an increased fever or chills.   You have chest pain.   You have severe shortness of breath.  You have bloody sputum.   You develop dehydration.  You develop fainting.  You develop repeated vomiting.  You develop a severe headache. MAKE SURE YOU:   Understand these instructions.  Will watch your condition.  Will get help right away if you are not doing well or get worse. Document Released: 12/10/2004 Document Revised: 07/05/2013 Document Reviewed: 04/25/2013 Exit[employer] Patient Information 2014 [employer].  

## 2014-01-01 ENCOUNTER — Ambulatory Visit: Payer: Medicare Other | Admitting: Family Medicine

## 2014-01-01 ENCOUNTER — Ambulatory Visit (INDEPENDENT_AMBULATORY_CARE_PROVIDER_SITE_OTHER): Payer: Medicare Other | Admitting: Internal Medicine

## 2014-01-01 ENCOUNTER — Encounter: Payer: Self-pay | Admitting: Internal Medicine

## 2014-01-01 VITALS — BP 150/102 | HR 81 | Temp 98.2°F | Wt 210.5 lb

## 2014-01-01 DIAGNOSIS — J209 Acute bronchitis, unspecified: Secondary | ICD-10-CM

## 2014-01-01 DIAGNOSIS — I10 Essential (primary) hypertension: Secondary | ICD-10-CM

## 2014-01-01 MED ORDER — HYDROCODONE-HOMATROPINE 5-1.5 MG/5ML PO SYRP: 5.0000 mL | ORAL_SOLUTION | Freq: Three times a day (TID) | ORAL | Status: DC | PRN

## 2014-01-01 MED ORDER — AMOXICILLIN 500 MG PO CAPS: 500.0000 mg | ORAL_CAPSULE | Freq: Three times a day (TID) | ORAL | Status: DC

## 2014-01-01 MED ORDER — LISINOPRIL-HYDROCHLOROTHIAZIDE 10-12.5 MG PO TABS: 1.0000 | ORAL_TABLET | Freq: Every day | ORAL | Status: DC

## 2014-01-01 NOTE — Assessment & Plan Note (Signed)
Not well controlled on Metoprolol Will d/c today Will start Lisinopril HCT 10-12.5  RTC in 1 month to recheck BP

## 2014-01-01 NOTE — Progress Notes (Signed)
HPI  Pt presents to the clinic today with c/o cough, chest congestion and fatigue. He reports this started about 3 days ago. The cough is productive of thick green/brown. He has tried left over Hycodan which has helped. He reports that ever since he had pneumonia in 09/2013, he has been having recurrent respiratory infections. He denies history of allergies or asthma. He has had sick contacts. He does not smoke.   Of note, his BP is elevated today. Looking back he has a history of HTN. He is on Metoprolol daily. It does not appear that his BP has been well controlled. He denies dizziness, blurred vision, chest pain, chest tightness or shortness of breath.  Review of Systems      Past Medical History  Diagnosis Date  . Hypertension 11/16/1994  . Hyperlipemia 04/17/1995  . BPH (benign prostatic hypertrophy) 11/16/1994  . Diverticulosis     Family History  Problem Relation Age of Onset  . Cancer Mother     breast CA Mets, recurrent  . Hypertension Father   . Cancer Father     prostate cancer  . Heart disease Father     MI,CABG  . Aneurysm Father   . Prostate cancer Father   . Stroke Father   . Cancer Sister     breast ca  . Colon cancer Neg Hx     History   Social History  . Marital Status: Married    Spouse Name: N/A    Number of Children: 2  . Years of Education: N/A   Occupational History  . Photographer    Social History Main Topics  . Smoking status: Never Smoker   . Smokeless tobacco: Not on file  . Alcohol Use: Yes     Comment: 2 drinks a day  . Drug Use: No  . Sexual Activity: Not on file   Other Topics Concern  . 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 Denies headache, fever or abrupt weight changes.  HEENT:  Positive sore throat. Denies eye redness, eye pain, pressure behind the eyes, facial pain, nasal congestion, ear pain, ringing in the  ears, wax buildup, runny nose or bloody nose. Respiratory: Positive cough. Denies difficulty breathing or shortness of breath.  Cardiovascular: Denies chest pain, chest tightness, palpitations or swelling in the hands or feet.   No other specific complaints in a complete review of systems (except as listed in HPI above).  Objective:   BP 150/102  Pulse 81  Temp(Src) 98.2 F (36.8 C) (Oral)  Wt 210 lb 8 oz (95.482 kg)  SpO2 97% Wt Readings from Last 3 Encounters:  01/01/14 210 lb 8 oz (95.482 kg)  11/24/13 210 lb 8 oz (95.482 kg)  08/18/13 219 lb 8 oz (99.565 kg)     General: Appears his stated age, well developed, well nourished in NAD. HEENT: Head: normal shape and size; Eyes: sclera white, no icterus, conjunctiva pink, PERRLA and EOMs intact; Ears: Tm's gray and intact, normal light reflex; Nose: mucosa pink and moist, septum midline; Throat/Mouth: + PND. Teeth present, mucosa erythematous and moist, no exudate noted, no lesions or ulcerations noted.  Neck: Neck supple, trachea midline. No massses, lumps or thyromegaly present.  Cardiovascular: Normal rate and rhythm. S1,S2 noted.  No murmur, rubs or gallops noted. No JVD or BLE edema. No carotid bruits noted. Pulmonary/Chest: Normal  effort and positive vesicular breath sounds. No respiratory distress. No wheezes, rales or ronchi noted.      Assessment & Plan:   Acute Bronchitis, likely viral:  Get some rest and drink plenty of water Do salt water gargles for the sore throat Refilled Hycodan cough syrup He did request a longer acting antibiotic to see if he can finally get rid of his respiratory illness, eRx for Amoxil- even though his exam appeared normal    RTC as needed or if symptoms persist.

## 2014-01-01 NOTE — Progress Notes (Signed)
Pre-visit discussion using our clinic review tool. No additional management support is needed unless otherwise documented below in the visit note.  

## 2014-01-01 NOTE — Patient Instructions (Addendum)

## 2014-02-02 ENCOUNTER — Ambulatory Visit (INDEPENDENT_AMBULATORY_CARE_PROVIDER_SITE_OTHER): Payer: Medicare Other | Admitting: Internal Medicine

## 2014-02-02 ENCOUNTER — Encounter: Payer: Self-pay | Admitting: Internal Medicine

## 2014-02-02 VITALS — BP 140/80 | HR 69 | Temp 98.1°F | Wt 207.5 lb

## 2014-02-02 DIAGNOSIS — R002 Palpitations: Secondary | ICD-10-CM

## 2014-02-02 DIAGNOSIS — I10 Essential (primary) hypertension: Secondary | ICD-10-CM

## 2014-02-02 LAB — COMPREHENSIVE METABOLIC PANEL
ALBUMIN: 4.5 g/dL (ref 3.5–5.2)
ALT: 24 U/L (ref 0–53)
AST: 22 U/L (ref 0–37)
Alkaline Phosphatase: 57 U/L (ref 39–117)
BUN: 14 mg/dL (ref 6–23)
CO2: 27 mEq/L (ref 19–32)
Calcium: 10.3 mg/dL (ref 8.4–10.5)
Chloride: 101 mEq/L (ref 96–112)
Creatinine, Ser: 1 mg/dL (ref 0.4–1.5)
GFR: 79.37 mL/min (ref >60.00)
Glucose, Bld: 105 mg/dL — ABNORMAL HIGH (ref 70–99)
POTASSIUM: 4.1 meq/L (ref 3.5–5.1)
SODIUM: 137 meq/L (ref 135–145)
Total Bilirubin: 0.4 mg/dL (ref 0.3–1.2)
Total Protein: 7.6 g/dL (ref 6.0–8.3)

## 2014-02-02 LAB — MAGNESIUM: Magnesium: 1.8 mg/dL (ref 1.5–2.5)

## 2014-02-02 MED ORDER — LISINOPRIL-HYDROCHLOROTHIAZIDE 10-12.5 MG PO TABS: 1.0000 | ORAL_TABLET | Freq: Every day | ORAL | Status: DC

## 2014-02-02 NOTE — Assessment & Plan Note (Signed)
Well controlled on current medication Will continue current medication at this time

## 2014-02-02 NOTE — Addendum Note (Signed)
Addended by: Jearld Fenton on: 02/02/2014 11:34 AM   Modules accepted: Orders

## 2014-02-02 NOTE — Progress Notes (Signed)
Pre visit review using our clinic review tool, if applicable. No additional management support is needed unless otherwise documented below in the visit note. 

## 2014-02-02 NOTE — Patient Instructions (Addendum)

## 2014-02-02 NOTE — Progress Notes (Signed)
Subjective:    Patient ID: Raymond Kirby, male    DOB: 1946/01/06, 68 y.o.   MRN: [id number]  HPI  Pt presents to the clinic today for 1 month f/u of HTN. His BP was not well controlled on Metoprolol. He was then started on Lisinopril-HCTZ. He reports he has been doing well on this medication without side effects. He has noticed some palpitations. He denies chest pain, chest tightness or shortness of breath.  Review of Systems  Past Medical History  Diagnosis Date  . Hypertension 11/16/1994  . Hyperlipemia 04/17/1995  . BPH (benign prostatic hypertrophy) 11/16/1994  . Diverticulosis     Current Outpatient Prescriptions  Medication Sig Dispense Refill  . atorvastatin (LIPITOR) 80 MG tablet Take 1 tablet (80 mg total) by mouth daily.  90 tablet  3  . finasteride (PROSCAR) 5 MG tablet Take 1 tablet (5 mg total) by mouth daily.  90 tablet  3  . HYDROcodone-homatropine (HYCODAN) 5-1.5 MG/5ML syrup Take 5 mLs by mouth every 8 (eight) hours as needed for cough.  180 mL  0  . lisinopril-hydrochlorothiazide (PRINZIDE,ZESTORETIC) 10-12.5 MG per tablet Take 1 tablet by mouth daily.  30 tablet  0  . omeprazole (PRILOSEC) 20 MG capsule Take 1 capsule (20 mg total) by mouth daily.  90 capsule  3  . sildenafil (VIAGRA) 100 MG tablet Take 100 mg by mouth as needed. One hour prior        No current facility-administered medications for this visit.    No Known Allergies  Family History  Problem Relation Age of Onset  . Cancer Mother     breast CA Mets, recurrent  . Hypertension Father   . Cancer Father     prostate cancer  . Heart disease Father     MI,CABG  . Aneurysm Father   . Prostate cancer Father   . Stroke Father   . Cancer Sister     breast ca  . Colon cancer Neg Hx     History   Social History  . Marital Status: Married    Spouse Name: N/A    Number of Children: 2  . Years of Education: N/A   Occupational History  . Photographer    Social History Main Topics  .  Smoking status: Never Smoker   . Smokeless tobacco: Not on file  . Alcohol Use: Yes     Comment: 2 drinks a day  . Drug Use: No  . Sexual Activity: Not on file   Other Topics Concern  . 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  Respiratory: Denies difficulty breathing, shortness of breath, cough or sputum production.   Cardiovascular: Pt reports palpitation. Denies chest pain, chest tightness, palpitations or swelling in the hands or feet.    No other specific complaints in a complete review of systems (except as listed in HPI above).     Objective:   Physical Exam  BP 140/80  Pulse 69  Temp(Src) 98.1 F (36.7 C) (Oral)  Wt 207 lb 8 oz (94.121 kg)  SpO2 99% Wt Readings from Last 3 Encounters:  02/02/14 207 lb 8 oz (94.121 kg)  01/01/14 210 lb 8 oz (95.482 kg)  11/24/13 210 lb 8 oz (95.482 kg)    General: Appears his stated age, well developed, well nourished in NAD. Cardiovascular:  Normal rate and rhythm. S1,S2 noted.  No murmur, rubs or gallops noted. No JVD or BLE edema. No carotid bruits noted. Pulmonary/Chest: Normal effort and positive vesicular breath sounds. No respiratory distress. No wheezes, rales or ronchi noted.    BMET    Component Value Date/Time   NA 139 08/10/2013 1024   K 4.4 08/10/2013 1024   CL 104 08/10/2013 1024   CO2 27 08/10/2013 1024   GLUCOSE 108* 08/10/2013 1024   BUN 13 08/10/2013 1024   CREATININE 1.0 08/10/2013 1024   CALCIUM 9.6 08/10/2013 1024   GFRNONAA 80.46 10/21/2009 1001   GFRAA 111 03/29/2008 0938    Lipid Panel     Component Value Date/Time   CHOL 175 08/10/2013 1024   TRIG 155.0* 08/10/2013 1024   HDL 51.30 08/10/2013 1024   CHOLHDL 3 08/10/2013 1024   VLDL 31.0 08/10/2013 1024   LDLCALC 93 08/10/2013 1024    CBC    Component Value Date/Time   WBC 6.5 05/10/2012 0933   RBC 4.66 05/10/2012 0933     HGB 14.9 05/10/2012 0933   HCT 45.1 05/10/2012 0933   PLT 217.0 05/10/2012 0933   MCV 96.8 05/10/2012 0933   MCHC 33.1 05/10/2012 0933   RDW 17.1* 05/10/2012 0933   LYMPHSABS 1.3 05/10/2012 0933   MONOABS 0.7 05/10/2012 0933   EOSABS 0.4 05/10/2012 0933   BASOSABS 0.1 05/10/2012 0933    Hgb A1C Lab Results  Component Value Date   HGBA1C 4.8 10/16/2005       Assessment & Plan:

## 2014-02-05 ENCOUNTER — Telehealth: Payer: Self-pay | Admitting: Family Medicine

## 2014-02-05 NOTE — Telephone Encounter (Signed)
Relevant patient education assigned to patient using Emmi. ° °

## 2014-04-30 ENCOUNTER — Other Ambulatory Visit: Payer: Self-pay | Admitting: Internal Medicine

## 2014-05-15 ENCOUNTER — Ambulatory Visit (INDEPENDENT_AMBULATORY_CARE_PROVIDER_SITE_OTHER): Payer: Medicare Other | Admitting: Family Medicine

## 2014-05-15 ENCOUNTER — Encounter: Payer: Self-pay | Admitting: Family Medicine

## 2014-05-15 VITALS — BP 130/70 | HR 80 | Temp 98.5°F | Wt 205.5 lb

## 2014-05-15 DIAGNOSIS — I10 Essential (primary) hypertension: Secondary | ICD-10-CM

## 2014-05-15 NOTE — Patient Instructions (Signed)
Your BP looks good.  Keep working on your weight and try to cut out salt. Take care.  Glad to see you.

## 2014-05-15 NOTE — Progress Notes (Signed)
Pre visit review using our clinic review tool, if applicable. No additional management support is needed unless otherwise documented below in the visit note.  Hypertension:    Using medication without problems or lightheadedness: yes Chest pain with exertion:no Edema:no Short of breath:no Average home BPs: not checked.   Intentional weight loss, 14 lbs since October.   No ADE on current meds.    Meds, vitals, and allergies reviewed.   ROS: See HPI.  Otherwise negative.    GEN: nad, alert and oriented HEENT: mucous membranes moist NECK: supple w/o LA CV: rrr. PULM: ctab, no inc wob ABD: soft, +bs EXT: no edema SKIN: no acute rash

## 2014-05-16 NOTE — Assessment & Plan Note (Signed)
Controlled, continue as is.  Recheck later at a CPE.  He agrees.  D/w pt.

## 2014-05-22 ENCOUNTER — Other Ambulatory Visit: Payer: Self-pay | Admitting: Family Medicine

## 2014-07-30 ENCOUNTER — Other Ambulatory Visit: Payer: Self-pay | Admitting: Family Medicine

## 2014-09-23 ENCOUNTER — Other Ambulatory Visit: Payer: Self-pay | Admitting: Family Medicine

## 2014-09-23 DIAGNOSIS — I10 Essential (primary) hypertension: Secondary | ICD-10-CM

## 2014-09-23 DIAGNOSIS — Z125 Encounter for screening for malignant neoplasm of prostate: Secondary | ICD-10-CM

## 2014-09-28 ENCOUNTER — Other Ambulatory Visit (INDEPENDENT_AMBULATORY_CARE_PROVIDER_SITE_OTHER): Payer: Medicare Other

## 2014-09-28 DIAGNOSIS — Z125 Encounter for screening for malignant neoplasm of prostate: Secondary | ICD-10-CM

## 2014-09-28 DIAGNOSIS — I10 Essential (primary) hypertension: Secondary | ICD-10-CM

## 2014-09-28 LAB — COMPREHENSIVE METABOLIC PANEL
ALT: 28 U/L (ref 0–53)
AST: 22 U/L (ref 0–37)
Albumin: 3.9 g/dL (ref 3.5–5.2)
Alkaline Phosphatase: 62 U/L (ref 39–117)
BUN: 15 mg/dL (ref 6–23)
CO2: 26 meq/L (ref 19–32)
Calcium: 10.2 mg/dL (ref 8.4–10.5)
Chloride: 100 mEq/L (ref 96–112)
Creatinine, Ser: 1 mg/dL (ref 0.4–1.5)
GFR: 76.56 mL/min (ref >60.00)
GLUCOSE: 132 mg/dL — AB (ref 70–99)
Potassium: 4.2 mEq/L (ref 3.5–5.1)
SODIUM: 137 meq/L (ref 135–145)
TOTAL PROTEIN: 7.3 g/dL (ref 6.0–8.3)
Total Bilirubin: 0.6 mg/dL (ref 0.2–1.2)

## 2014-09-28 LAB — LIPID PANEL
CHOLESTEROL: 191 mg/dL (ref 0–200)
HDL: 44.8 mg/dL (ref >39.00)
LDL Cholesterol: 120 mg/dL — ABNORMAL HIGH (ref 0–99)
NonHDL: 146.2
TRIGLYCERIDES: 132 mg/dL (ref 0.0–149.0)
Total CHOL/HDL Ratio: 4
VLDL: 26.4 mg/dL (ref 0.0–40.0)

## 2014-09-28 LAB — PSA, MEDICARE: PSA: 0.81 ng/mL (ref 0.10–4.00)

## 2014-10-01 ENCOUNTER — Encounter: Payer: Self-pay | Admitting: Family Medicine

## 2014-10-01 ENCOUNTER — Ambulatory Visit (INDEPENDENT_AMBULATORY_CARE_PROVIDER_SITE_OTHER): Payer: Medicare Other | Admitting: Family Medicine

## 2014-10-01 VITALS — BP 138/80 | HR 78 | Temp 99.3°F | Ht 70.0 in | Wt 207.5 lb

## 2014-10-01 DIAGNOSIS — E785 Hyperlipidemia, unspecified: Secondary | ICD-10-CM

## 2014-10-01 DIAGNOSIS — Z7189 Other specified counseling: Secondary | ICD-10-CM

## 2014-10-01 DIAGNOSIS — I10 Essential (primary) hypertension: Secondary | ICD-10-CM

## 2014-10-01 DIAGNOSIS — Z23 Encounter for immunization: Secondary | ICD-10-CM

## 2014-10-01 DIAGNOSIS — Z8042 Family history of malignant neoplasm of prostate: Secondary | ICD-10-CM

## 2014-10-01 DIAGNOSIS — R739 Hyperglycemia, unspecified: Secondary | ICD-10-CM

## 2014-10-01 DIAGNOSIS — Z0000 Encounter for general adult medical examination without abnormal findings: Secondary | ICD-10-CM

## 2014-10-01 DIAGNOSIS — K645 Perianal venous thrombosis: Secondary | ICD-10-CM

## 2014-10-01 LAB — HEMOGLOBIN A1C: HEMOGLOBIN A1C: 6.1 % (ref 4.6–6.5)

## 2014-10-01 MED ORDER — OMEPRAZOLE 20 MG PO CPDR: DELAYED_RELEASE_CAPSULE | ORAL | Status: DC

## 2014-10-01 MED ORDER — LISINOPRIL-HYDROCHLOROTHIAZIDE 20-12.5 MG PO TABS: ORAL_TABLET | ORAL | Status: DC

## 2014-10-01 MED ORDER — FINASTERIDE 5 MG PO TABS: ORAL_TABLET | ORAL | Status: DC

## 2014-10-01 MED ORDER — ATORVASTATIN CALCIUM 80 MG PO TABS: ORAL_TABLET | ORAL | Status: DC

## 2014-10-01 MED ORDER — HYDROCORTISONE ACETATE 25 MG RE SUPP: 25.0000 mg | Freq: Two times a day (BID) | RECTAL | Status: AC

## 2014-10-01 NOTE — Patient Instructions (Signed)
Go to the lab on the way out.  We'll contact you with your lab report. Look at [email or web address] and the eat right diet.  Keep working on your weight.  Take care.

## 2014-10-01 NOTE — Progress Notes (Signed)
Pre visit review using our clinic review tool, if applicable. No additional management support is needed unless otherwise documented below in the visit note.  I have personally reviewed the Medicare Annual Wellness questionnaire and have noted 1. The patient's medical and social history 2. Their use of alcohol, tobacco or illicit drugs 3. Their current medications and supplements 4. The patient's functional ability including ADL's, fall risks, home safety risks and hearing or visual             impairment. 5. Diet and physical activities 6. Evidence for depression or mood disorders  The patients weight, height, BMI have been recorded in the chart and visual acuity is per eye clinic.  I have made referrals, counseling and provided education to the patient based review of the above and I have provided the pt with a written personalized care plan for preventive services.  Provider list updated- see scanned forms.  Routine anticipatory guidance given to patient.  See health maintenance.  Flu 2015 Shingles 2009 PNA 2014 Tetanus 2006 Colonoscopy 2007 Prostate cancer screening wnl 2015 Advance directive- wife designated if patient were incapacitated.  Cognitive function addressed- see scanned forms- and if abnormal then additional documentation follows.   Hypertension:    Using medication without problems or lightheadedness: yes Chest pain with exertion:no Edema:no Short of breath:no  Elevated Cholesterol: Using medications without problems:yes Muscle aches: no Diet compliance: needs low carb diet, d/w pt.   Exercise: needs to inc, d/w pt.  Hyperglycemia noted.  A1c pending. D/w pt about weight loss and diet, low carb foods.  Info given to patient.  He understood.   PMH and SH reviewed  Meds, vitals, and allergies reviewed.   ROS: See HPI.  Otherwise negative.    GEN: nad, alert and oriented HEENT: mucous membranes moist NECK: supple w/o LA CV: rrr. PULM: ctab, no inc  wob ABD: soft, +bs EXT: no edema SKIN: no acute rash

## 2014-10-02 ENCOUNTER — Other Ambulatory Visit: Payer: Self-pay | Admitting: Family Medicine

## 2014-10-02 DIAGNOSIS — Z7189 Other specified counseling: Secondary | ICD-10-CM | POA: Insufficient documentation

## 2014-10-02 DIAGNOSIS — Z8042 Family history of malignant neoplasm of prostate: Secondary | ICD-10-CM | POA: Insufficient documentation

## 2014-10-02 DIAGNOSIS — R739 Hyperglycemia, unspecified: Secondary | ICD-10-CM

## 2014-10-02 NOTE — Assessment & Plan Note (Signed)
Controlled, continue current meds, needs diet and exercise.  Labs d/w pt.

## 2014-10-02 NOTE — Assessment & Plan Note (Signed)
LDL up, HDL down, continue current meds, needs diet and exercise.  Labs d/w pt.  He agrees.

## 2014-10-02 NOTE — Assessment & Plan Note (Signed)
Flu 2015 Shingles 2009 PNA 2014 Tetanus 2006 Colonoscopy 2007 Prostate cancer screening wnl 2015 Advance directive- wife designated if patient were incapacitated.  Cognitive function addressed- see scanned forms- and if abnormal then additional documentation follows.

## 2014-10-02 NOTE — Assessment & Plan Note (Signed)
See f/u notes on A1c.  D/w pt about weight loss and diet, low carb foods. Info given to patient. He understood.

## 2014-10-29 ENCOUNTER — Ambulatory Visit (INDEPENDENT_AMBULATORY_CARE_PROVIDER_SITE_OTHER): Payer: Medicare Other | Admitting: Family Medicine

## 2014-10-29 ENCOUNTER — Encounter: Payer: Self-pay | Admitting: Family Medicine

## 2014-10-29 VITALS — BP 122/60 | HR 94 | Temp 99.6°F | Wt 208.2 lb

## 2014-10-29 DIAGNOSIS — R05 Cough: Secondary | ICD-10-CM

## 2014-10-29 DIAGNOSIS — R059 Cough, unspecified: Secondary | ICD-10-CM

## 2014-10-29 MED ORDER — AZITHROMYCIN 250 MG PO TABS: ORAL_TABLET | ORAL | Status: DC

## 2014-10-29 MED ORDER — HYDROCODONE-HOMATROPINE 5-1.5 MG/5ML PO SYRP: 5.0000 mL | ORAL_SOLUTION | Freq: Three times a day (TID) | ORAL | Status: DC | PRN

## 2014-10-29 NOTE — Progress Notes (Signed)
Pre visit review using our clinic review tool, if applicable. No additional management support is needed unless otherwise documented below in the visit note.  Sick for the last ~7 days.  Cough, some sputum, chest is sore from cough.  Taking otc cough meds.  Overall worse in the last few days.  A little lightheaded this AM, not syncopal.   No myalgias. Some wheeze, worse at night.   No ear pain.  ST form the cough.  Some rhinorrhea.   Temp not checked at hom.   Meds, vitals, and allergies reviewed.   ROS: See HPI.  Otherwise, noncontributory.  GEN: nad, alert and oriented HEENT: mucous membranes moist, tm w/o erythema, nasal exam w/o erythema, clear discharge noted,  OP with cobblestoning NECK: supple w/o LA CV: rrr.   PULM: ctab, no inc wob EXT: no edema SKIN: no acute rash

## 2014-10-29 NOTE — Patient Instructions (Signed)
Skip your BP medicine until you are no longer lightheaded. Drink plenty of fluids and start the antibiotics.   Use the cough medicine if needed.  Take care.

## 2014-10-29 NOTE — Assessment & Plan Note (Signed)
Presumed bronchitis, nontoxic, told patient to skip BP medicine until no longer lightheaded.  Drink plenty of fluids and start the antibiotics.  Use the cough medicine if needed, sedation caution.  He agrees.

## 2015-02-26 ENCOUNTER — Other Ambulatory Visit (INDEPENDENT_AMBULATORY_CARE_PROVIDER_SITE_OTHER): Payer: PPO

## 2015-02-26 DIAGNOSIS — R739 Hyperglycemia, unspecified: Secondary | ICD-10-CM | POA: Diagnosis not present

## 2015-02-26 LAB — GLUCOSE, RANDOM: Glucose, Bld: 114 mg/dL — ABNORMAL HIGH (ref 70–99)

## 2015-02-26 LAB — HEMOGLOBIN A1C: Hgb A1c MFr Bld: 6.2 % (ref 4.6–6.5)

## 2015-03-05 ENCOUNTER — Ambulatory Visit (INDEPENDENT_AMBULATORY_CARE_PROVIDER_SITE_OTHER): Payer: PPO | Admitting: Family Medicine

## 2015-03-05 ENCOUNTER — Encounter: Payer: Self-pay | Admitting: Family Medicine

## 2015-03-05 VITALS — BP 136/76 | HR 82 | Temp 99.0°F | Wt 208.5 lb

## 2015-03-05 DIAGNOSIS — R739 Hyperglycemia, unspecified: Secondary | ICD-10-CM

## 2015-03-05 DIAGNOSIS — R21 Rash and other nonspecific skin eruption: Secondary | ICD-10-CM

## 2015-03-05 MED ORDER — TRIAMCINOLONE ACETONIDE 0.1 % EX CREA: 1.0000 "application " | TOPICAL_CREAM | Freq: Two times a day (BID) | CUTANEOUS | Status: DC

## 2015-03-05 NOTE — Patient Instructions (Signed)
Use the eat right diet and try not to skip lunch.   Use TAC on the rash as needed.  That should help.  Take care.   Physical in about 6 months.

## 2015-03-05 NOTE — Progress Notes (Signed)
Pre visit review using our clinic review tool, if applicable. No additional management support is needed unless otherwise documented below in the visit note.  Hyperglycemia f/u.  No DM2 dx.  Cut out sodas.  Weight stable.  Labs d/w pt.  Exercise d/w pt- encouraged more.    Meds, vitals, and allergies reviewed.   ROS: See HPI.  Otherwise, noncontributory.  nad ncat rrr ctab Rash on the B shins, medially, R>L.  Blanches.  (itches, has noted the spring prev).

## 2015-03-06 DIAGNOSIS — R21 Rash and other nonspecific skin eruption: Secondary | ICD-10-CM | POA: Insufficient documentation

## 2015-03-06 NOTE — Assessment & Plan Note (Signed)
Could be a seasonal/eczema variant.  Use TAC topically and fu prn.  He agrees.

## 2015-03-06 NOTE — Assessment & Plan Note (Signed)
Given copy of eat right diet, labs d/w pt.  Recheck in about 6 months at a physical, continue work on diet and weight.  He agrees.

## 2015-05-16 ENCOUNTER — Ambulatory Visit (INDEPENDENT_AMBULATORY_CARE_PROVIDER_SITE_OTHER): Payer: PPO | Admitting: Family Medicine

## 2015-05-16 ENCOUNTER — Encounter: Payer: Self-pay | Admitting: Family Medicine

## 2015-05-16 VITALS — BP 128/78 | HR 68 | Temp 99.0°F | Wt 208.0 lb

## 2015-05-16 DIAGNOSIS — S90822A Blister (nonthermal), left foot, initial encounter: Secondary | ICD-10-CM

## 2015-05-16 NOTE — Progress Notes (Signed)
Pre visit review using our clinic review tool, if applicable. No additional management support is needed unless otherwise documented below in the visit note.  6[place]11-6/18 [place] the beach.  Had been wearing sandals a lot.  Lesion between L 2nd and 3rd toe noted ~05/09/15.   No FCNVAD.  Area itches, locally.  Never drained.    Meds, vitals, and allergies reviewed.   ROS: See HPI.  Otherwise, noncontributory.  nad L foot with normal inspection except for blister between 2nd and 3rd toe.  Doesn't look infected.   Normal inspection, normal BP pulse

## 2015-05-16 NOTE — Patient Instructions (Signed)
It looks like a blister and it should slowly resolve.  It will likely rupture on its own.   Use triamcinolone if needed.  It doesn't look infected.

## 2015-05-17 DIAGNOSIS — S90829A Blister (nonthermal), unspecified foot, initial encounter: Secondary | ICD-10-CM | POA: Insufficient documentation

## 2015-05-17 NOTE — Assessment & Plan Note (Signed)
Should resolved, see AVS.  Can use TAC topically if really itchy.  F/u prn.  Doesn't look infected.

## 2015-08-21 ENCOUNTER — Other Ambulatory Visit: Payer: Self-pay | Admitting: *Deleted

## 2015-08-21 MED ORDER — LISINOPRIL-HYDROCHLOROTHIAZIDE 20-12.5 MG PO TABS: ORAL_TABLET | ORAL | Status: DC

## 2015-08-21 NOTE — Telephone Encounter (Signed)
Received faxed refill request from pharmacy. Refill sent to pharmacy electronically. 

## 2015-10-02 ENCOUNTER — Other Ambulatory Visit: Payer: Self-pay | Admitting: Family Medicine

## 2015-10-02 ENCOUNTER — Other Ambulatory Visit (INDEPENDENT_AMBULATORY_CARE_PROVIDER_SITE_OTHER): Payer: PPO

## 2015-10-02 DIAGNOSIS — Z125 Encounter for screening for malignant neoplasm of prostate: Secondary | ICD-10-CM

## 2015-10-02 DIAGNOSIS — R739 Hyperglycemia, unspecified: Secondary | ICD-10-CM

## 2015-10-02 DIAGNOSIS — E785 Hyperlipidemia, unspecified: Secondary | ICD-10-CM

## 2015-10-02 LAB — COMPREHENSIVE METABOLIC PANEL
ALBUMIN: 4.5 g/dL (ref 3.5–5.2)
ALT: 24 U/L (ref 0–53)
AST: 19 U/L (ref 0–37)
Alkaline Phosphatase: 55 U/L (ref 39–117)
BILIRUBIN TOTAL: 0.5 mg/dL (ref 0.2–1.2)
BUN: 11 mg/dL (ref 6–23)
CALCIUM: 10.5 mg/dL (ref 8.4–10.5)
CHLORIDE: 101 meq/L (ref 96–112)
CO2: 29 meq/L (ref 19–32)
CREATININE: 0.97 mg/dL (ref 0.40–1.50)
GFR: 81.8 mL/min (ref >60.00)
Glucose, Bld: 113 mg/dL — ABNORMAL HIGH (ref 70–99)
Potassium: 4.2 mEq/L (ref 3.5–5.1)
Sodium: 140 mEq/L (ref 135–145)
Total Protein: 7.2 g/dL (ref 6.0–8.3)

## 2015-10-02 LAB — LIPID PANEL
CHOLESTEROL: 188 mg/dL (ref 0–200)
HDL: 60.8 mg/dL (ref >39.00)
LDL CALC: 107 mg/dL — AB (ref 0–99)
NonHDL: 127.07
TRIGLYCERIDES: 99 mg/dL (ref 0.0–149.0)
Total CHOL/HDL Ratio: 3
VLDL: 19.8 mg/dL (ref 0.0–40.0)

## 2015-10-02 LAB — HEMOGLOBIN A1C: HEMOGLOBIN A1C: 6.3 % (ref 4.6–6.5)

## 2015-10-02 LAB — PSA, MEDICARE: PSA: 0.75 ng/ml (ref 0.10–4.00)

## 2015-10-07 ENCOUNTER — Other Ambulatory Visit: Payer: PPO

## 2015-10-14 ENCOUNTER — Ambulatory Visit (INDEPENDENT_AMBULATORY_CARE_PROVIDER_SITE_OTHER): Payer: PPO | Admitting: Family Medicine

## 2015-10-14 ENCOUNTER — Encounter: Payer: Self-pay | Admitting: Family Medicine

## 2015-10-14 VITALS — BP 122/78 | HR 68 | Temp 98.3°F | Ht 70.0 in | Wt 206.0 lb

## 2015-10-14 DIAGNOSIS — R739 Hyperglycemia, unspecified: Secondary | ICD-10-CM | POA: Diagnosis not present

## 2015-10-14 DIAGNOSIS — R05 Cough: Secondary | ICD-10-CM

## 2015-10-14 DIAGNOSIS — Z0000 Encounter for general adult medical examination without abnormal findings: Secondary | ICD-10-CM | POA: Diagnosis not present

## 2015-10-14 DIAGNOSIS — E785 Hyperlipidemia, unspecified: Secondary | ICD-10-CM

## 2015-10-14 DIAGNOSIS — R059 Cough, unspecified: Secondary | ICD-10-CM

## 2015-10-14 DIAGNOSIS — K219 Gastro-esophageal reflux disease without esophagitis: Secondary | ICD-10-CM

## 2015-10-14 DIAGNOSIS — Z23 Encounter for immunization: Secondary | ICD-10-CM | POA: Diagnosis not present

## 2015-10-14 DIAGNOSIS — Z119 Encounter for screening for infectious and parasitic diseases, unspecified: Secondary | ICD-10-CM

## 2015-10-14 DIAGNOSIS — N400 Enlarged prostate without lower urinary tract symptoms: Secondary | ICD-10-CM

## 2015-10-14 DIAGNOSIS — I10 Essential (primary) hypertension: Secondary | ICD-10-CM | POA: Diagnosis not present

## 2015-10-14 MED ORDER — LISINOPRIL-HYDROCHLOROTHIAZIDE 20-12.5 MG PO TABS: ORAL_TABLET | ORAL | Status: DC

## 2015-10-14 MED ORDER — FINASTERIDE 5 MG PO TABS: ORAL_TABLET | ORAL | Status: DC

## 2015-10-14 MED ORDER — HYDROCODONE-HOMATROPINE 5-1.5 MG/5ML PO SYRP
5.0000 mL | ORAL_SOLUTION | Freq: Three times a day (TID) | ORAL | Status: DC | PRN
Start: 2015-10-14 — End: 2016-03-12

## 2015-10-14 MED ORDER — OMEPRAZOLE 20 MG PO CPDR: DELAYED_RELEASE_CAPSULE | ORAL | Status: DC

## 2015-10-14 MED ORDER — ATORVASTATIN CALCIUM 80 MG PO TABS: ORAL_TABLET | ORAL | Status: DC

## 2015-10-14 NOTE — Patient Instructions (Addendum)
Check with your insurance to see if they will cover the tetanus shot. Call about an eye exam. If the cough doesn't gradually resolve with the medicine then update me.   Take care.  Glad to see you.

## 2015-10-14 NOTE — Progress Notes (Signed)
Pre visit review using our clinic review tool, if applicable. No additional management support is needed unless otherwise documented below in the visit note.  I have personally reviewed the Medicare Annual Wellness questionnaire and have noted 1. The patient's medical and social history 2. Their use of alcohol, tobacco or illicit drugs 3. Their current medications and supplements 4. The patient's functional ability including ADL's, fall risks, home safety risks and hearing or visual             impairment. 5. Diet and physical activities 6. Evidence for depression or mood disorders  The patients weight, height, BMI have been recorded in the chart and visual acuity is per eye clinic.  I have made referrals, counseling and provided education to the patient based review of the above and I have provided the pt with a written personalized care plan for preventive services.  Provider list updated- see scanned forms.  Routine anticipatory guidance given to patient.  See health maintenance.  Flu 2016 Shingles 2009 PNA 2015 Tetanus 2006, d/w pt.  Colonoscopy 2007 PSA 2016, wno Advance directive- wife designated if patient were incapacitated.  Pt opts in for HCV screening.  D/w pt re: routine screening.   Cognitive function addressed- see scanned forms- and if abnormal then additional documentation follows.   Hypertension:    Using medication without problems or lightheadedness:  yes Chest pain with exertion:no Edema:no Short of breath:no  Elevated Cholesterol: Using medications without problems: yes Muscle aches: no Diet compliance:yes Exercise:yes  Hyperglycemia.  Not a dx of DM2.  Labs d/w pt.   D/w pt about diet and exercise.    BPH.  Nocturia x2, not worse, stable.  PSA wnl, no ADE on med.  No pain with urination.    GERD controlled on PPI w/o ADE.  Compliant.  No SX currently.    Recently with a cough, improving, had antecedent cold sx, improved.   PMH and SH  reviewed  Meds, vitals, and allergies reviewed.   ROS: See HPI.  Otherwise negative.    GEN: nad, alert and oriented HEENT: mucous membranes moist NECK: supple w/o LA CV: rrr. PULM: ctab, no inc wob ABD: soft, +bs EXT: no edema SKIN: no acute rash

## 2015-10-16 DIAGNOSIS — K219 Gastro-esophageal reflux disease without esophagitis: Secondary | ICD-10-CM | POA: Insufficient documentation

## 2015-10-16 NOTE — Assessment & Plan Note (Signed)
Controlled, needs work on diet and exercise,  No change in meds.  Continue as is.  Lab d/w pt.

## 2015-10-16 NOTE — Assessment & Plan Note (Signed)
Sx controlled fairly well, not having enough bothersome nocturia to change meds.  He agrees.

## 2015-10-16 NOTE — Assessment & Plan Note (Signed)
Above 100 but not diabetic.  Needs work on diet and exercise.  He agrees.  Recheck periodically.

## 2015-10-16 NOTE — Assessment & Plan Note (Signed)
Controlled, continue as is.  Needs work on diet and weight.

## 2015-10-16 NOTE — Assessment & Plan Note (Signed)
Ctab, should resolve.  F/u prn, as needed.

## 2015-10-16 NOTE — Assessment & Plan Note (Signed)
Flu 2016 Shingles 2009 PNA 2015 Tetanus 2006, d/w pt.  Colonoscopy 2007 PSA 2016, wno Advance directive- wife designated if patient were incapacitated.  Pt opts in for HCV screening.  D/w pt re: routine screening.   Cognitive function addressed- see scanned forms- and if abnormal then additional documentation follows.

## 2015-12-12 DIAGNOSIS — H40051 Ocular hypertension, right eye: Secondary | ICD-10-CM | POA: Diagnosis not present

## 2016-01-03 DIAGNOSIS — H40051 Ocular hypertension, right eye: Secondary | ICD-10-CM | POA: Diagnosis not present

## 2016-03-12 ENCOUNTER — Encounter: Payer: Self-pay | Admitting: Family Medicine

## 2016-03-12 ENCOUNTER — Ambulatory Visit (INDEPENDENT_AMBULATORY_CARE_PROVIDER_SITE_OTHER): Payer: PPO | Admitting: Family Medicine

## 2016-03-12 VITALS — BP 108/64 | HR 88 | Temp 98.2°F | Ht 70.0 in | Wt 209.8 lb

## 2016-03-12 DIAGNOSIS — J40 Bronchitis, not specified as acute or chronic: Secondary | ICD-10-CM

## 2016-03-12 MED ORDER — AZITHROMYCIN 250 MG PO TABS: ORAL_TABLET | ORAL | Status: DC

## 2016-03-12 MED ORDER — HYDROCODONE-HOMATROPINE 5-1.5 MG/5ML PO SYRP: 5.0000 mL | ORAL_SOLUTION | Freq: Three times a day (TID) | ORAL | Status: DC | PRN

## 2016-03-12 NOTE — Patient Instructions (Signed)
Nice to meet you. You likely a bronchitis. We will treat you with azithromycin. We will additionally give you Hycodan for cough. This could make you drowsy so be wary of taking this. If you develop chest pain, shortness of breath, cough productive of blood, fevers, or any new or changing symptoms please seek medical attention.

## 2016-03-12 NOTE — Progress Notes (Signed)
Pre visit review using our clinic review tool, if applicable. No additional management support is needed unless otherwise documented below in the visit note. 

## 2016-03-12 NOTE — Assessment & Plan Note (Signed)
Patient's symptoms most consistent with bronchitis. Suspect viral in nature or atypical bacterial cause. Benign lung exam. No focal findings to indicate pneumonia. Discussed potential treatment options with the patient. Patient declined steroids. Declined inhaler. Opted for antibiotic treatment. We'll start on azithromycin. Hycodan for cough. Given return precautions.

## 2016-03-12 NOTE — Progress Notes (Signed)
Patient ID: Raymond Kirby, male   DOB: May 30, 1946, 70 y.o.   MRN: [id number]  Tommi Rumps, MD Phone: [phone]  Raymond Kirby is a 70 y.o. male who presents today for same-day visit.  Patient notes onset of symptoms 1-2 weeks ago. Started with chest congestion and productive cough of yellow mucus. Notes no sinus congestion. He is blowing some clear mucus out of his nose. No fevers though he did note he had some sweats yesterday. Did have some minimal shortness of breath only with cough yesterday. No chest pain. No shortness of breath at this time. Feels as though he is getting progressively worse over the last week.  PMH: nonsmoker.   ROS see history of present illness  Objective  Physical Exam Filed Vitals:   03/12/16 1034  BP: 108/64  Pulse: 88  Temp: 98.2 F (36.8 C)    Physical Exam  Constitutional: He is well-developed, well-nourished, and in no distress.  HENT:  Head: Normocephalic and atraumatic.  Right Ear: External ear normal.  Left Ear: External ear normal.  Mouth/Throat: Oropharynx is clear and moist. No oropharyngeal exudate.  Normal TMs bilaterally  Eyes: Conjunctivae are normal. Pupils are equal, round, and reactive to light.  Neck: Neck supple.  Cardiovascular: Normal rate, regular rhythm and normal heart sounds.   Pulmonary/Chest: Effort normal and breath sounds normal.  Lymphadenopathy:    He has no cervical adenopathy.  Neurological: He is alert. Gait normal.  Skin: Skin is warm and dry. He is not diaphoretic.     Assessment/Plan: Please see individual problem list.  Bronchitis Patient's symptoms most consistent with bronchitis. Suspect viral in nature or atypical bacterial cause. Benign lung exam. No focal findings to indicate pneumonia. Discussed potential treatment options with the patient. Patient declined steroids. Declined inhaler. Opted for antibiotic treatment. We'll start on azithromycin. Hycodan for cough. Given return  precautions.    No orders of the defined types were placed in this encounter.    Meds ordered this encounter  Medications  . azithromycin (ZITHROMAX) 250 MG tablet    Sig: Take 500 mg (2 tablets) by mouth today, then take 250 mg (1 tablet) by mouth for the next 4 days    Dispense:  6 tablet    Refill:  0  . HYDROcodone-homatropine (HYCODAN) 5-1.5 MG/5ML syrup    Sig: Take 5 mLs by mouth every 8 (eight) hours as needed for cough.    Dispense:  240 mL    Refill:  0    Tommi Rumps, MD [place]

## 2016-04-30 ENCOUNTER — Other Ambulatory Visit: Payer: Self-pay | Admitting: Family Medicine

## 2016-04-30 NOTE — Telephone Encounter (Signed)
Electronic refill request.  Looks like this hasn't been filled in quite some time, ? 2013.  Pt has CPE scheduled in early December 2017.  Please advise.

## 2016-04-30 NOTE — Telephone Encounter (Signed)
Sent. Thanks.   

## 2016-07-01 DIAGNOSIS — H40051 Ocular hypertension, right eye: Secondary | ICD-10-CM | POA: Diagnosis not present

## 2016-07-16 DIAGNOSIS — H40051 Ocular hypertension, right eye: Secondary | ICD-10-CM | POA: Diagnosis not present

## 2016-07-30 DIAGNOSIS — H40051 Ocular hypertension, right eye: Secondary | ICD-10-CM | POA: Diagnosis not present

## 2016-08-11 ENCOUNTER — Other Ambulatory Visit: Payer: Self-pay | Admitting: Family Medicine

## 2016-10-11 ENCOUNTER — Other Ambulatory Visit: Payer: Self-pay | Admitting: Family Medicine

## 2016-10-11 DIAGNOSIS — Z125 Encounter for screening for malignant neoplasm of prostate: Secondary | ICD-10-CM

## 2016-10-11 DIAGNOSIS — R739 Hyperglycemia, unspecified: Secondary | ICD-10-CM

## 2016-10-11 DIAGNOSIS — I10 Essential (primary) hypertension: Secondary | ICD-10-CM

## 2016-10-14 ENCOUNTER — Other Ambulatory Visit: Payer: PPO

## 2016-10-14 ENCOUNTER — Ambulatory Visit (INDEPENDENT_AMBULATORY_CARE_PROVIDER_SITE_OTHER): Payer: PPO

## 2016-10-14 VITALS — BP 110/70 | HR 67 | Temp 98.1°F | Ht 70.0 in | Wt 206.0 lb

## 2016-10-14 DIAGNOSIS — I10 Essential (primary) hypertension: Secondary | ICD-10-CM | POA: Diagnosis not present

## 2016-10-14 DIAGNOSIS — Z125 Encounter for screening for malignant neoplasm of prostate: Secondary | ICD-10-CM

## 2016-10-14 DIAGNOSIS — Z0000 Encounter for general adult medical examination without abnormal findings: Secondary | ICD-10-CM

## 2016-10-14 DIAGNOSIS — Z119 Encounter for screening for infectious and parasitic diseases, unspecified: Secondary | ICD-10-CM | POA: Diagnosis not present

## 2016-10-14 DIAGNOSIS — R739 Hyperglycemia, unspecified: Secondary | ICD-10-CM

## 2016-10-14 DIAGNOSIS — Z23 Encounter for immunization: Secondary | ICD-10-CM

## 2016-10-14 LAB — LIPID PANEL
CHOL/HDL RATIO: 3
Cholesterol: 175 mg/dL (ref 0–200)
HDL: 59.3 mg/dL (ref >39.00)
LDL Cholesterol: 100 mg/dL — ABNORMAL HIGH (ref 0–99)
NONHDL: 116.19
Triglycerides: 83 mg/dL (ref 0.0–149.0)
VLDL: 16.6 mg/dL (ref 0.0–40.0)

## 2016-10-14 LAB — COMPREHENSIVE METABOLIC PANEL
ALT: 17 U/L (ref 0–53)
AST: 15 U/L (ref 0–37)
Albumin: 4.5 g/dL (ref 3.5–5.2)
Alkaline Phosphatase: 57 U/L (ref 39–117)
BILIRUBIN TOTAL: 0.5 mg/dL (ref 0.2–1.2)
BUN: 11 mg/dL (ref 6–23)
CO2: 29 meq/L (ref 19–32)
Calcium: 10.4 mg/dL (ref 8.4–10.5)
Chloride: 103 mEq/L (ref 96–112)
Creatinine, Ser: 0.96 mg/dL (ref 0.40–1.50)
GFR: 82.53 mL/min (ref >60.00)
GLUCOSE: 111 mg/dL — AB (ref 70–99)
POTASSIUM: 4.6 meq/L (ref 3.5–5.1)
SODIUM: 139 meq/L (ref 135–145)
Total Protein: 7.1 g/dL (ref 6.0–8.3)

## 2016-10-14 LAB — HEMOGLOBIN A1C: Hgb A1c MFr Bld: 6.3 % (ref 4.6–6.5)

## 2016-10-14 LAB — PSA, MEDICARE: PSA: 0.68 ng/ml (ref 0.10–4.00)

## 2016-10-14 NOTE — Progress Notes (Signed)
PCP notes:   Health maintenance:  Flu vaccine - administered Tetanus - postponed/insurance Colonoscopy - pt will discuss with PCP at next appt Hep C screening - completed  Abnormal screenings:   Hearing - failed  Patient concerns:   None  Nurse concerns:  None  Next PCP appt:   10/19/16 @ 1045

## 2016-10-14 NOTE — Progress Notes (Signed)
Pre visit review using our clinic review tool, if applicable. No additional management support is needed unless otherwise documented below in the visit note. 

## 2016-10-14 NOTE — Patient Instructions (Addendum)
Raymond Kirby , Thank you for taking time to come for your Medicare Wellness Visit. I appreciate your ongoing commitment to your health goals. Please review the following plan we discussed and let me know if I can assist you in the future.   These are the goals we discussed: Goals    . Increase water intake          Starting 10/14/2016, I will attempt to drink at least 8 oz water with each meal daily.        This is a list of the screening recommended for you and due dates:  Health Maintenance  Topic Date Due  . Tetanus Vaccine  11/10/2025*  . Colon Cancer Screening  06/27/2026*  . Flu Shot  Completed  . Shingles Vaccine  Completed  .  Hepatitis C: One time screening is recommended by Center for Disease Control  (CDC) for  adults born from [birth year] through 1964.   Completed  . Pneumonia vaccines  Completed  *Topic was postponed. The date shown is not the original due date.     Preventive Care for Adults  A healthy lifestyle and preventive care can promote health and wellness. Preventive health guidelines for adults include the following key practices.  . A routine yearly physical is a good way to check with your health care provider about your health and preventive screening. It is a chance to share any concerns and updates on your health and to receive a thorough exam.  . Visit your dentist for a routine exam and preventive care every 6 months. Brush your teeth twice a day and floss once a day. Good oral hygiene prevents tooth decay and gum disease.  . The frequency of eye exams is based on your age, health, family medical history, use  of contact lenses, and other factors. Follow your health care provider's ecommendations for frequency of eye exams.  . Eat a healthy diet. Foods like vegetables, fruits, whole grains, low-fat dairy products, and lean protein foods contain the nutrients you need without too many calories. Decrease your intake of foods high in solid fats, added sugars, and  salt. Eat the right amount of calories for you. Get information about a proper diet from your health care provider, if necessary.  . Regular physical exercise is one of the most important things you can do for your health. Most adults should get at least 150 minutes of moderate-intensity exercise (any activity that increases your heart rate and causes you to sweat) each week. In addition, most adults need muscle-strengthening exercises on 2 or more days a week.  Silver Sneakers may be a benefit available to you. To determine eligibility, you may visit the website: www.silversneakers.com or contact program at 501-272-5669 Mon-Fri between 8AM-8PM.   . Maintain a healthy weight. The body mass index (BMI) is a screening tool to identify possible weight problems. It provides an estimate of body fat based on height and weight. Your health care provider can find your BMI and can help you achieve or maintain a healthy weight.   For adults 20 years and older: ? A BMI below 18.5 is considered underweight. ? A BMI of 18.5 to 24.9 is normal. ? A BMI of 25 to 29.9 is considered overweight. ? A BMI of 30 and above is considered obese.   . Maintain normal blood lipids and cholesterol levels by exercising and minimizing your intake of saturated fat. Eat a balanced diet with plenty of fruit and vegetables. Blood tests for  lipids and cholesterol should begin at age 53 and be repeated every 5 years. If your lipid or cholesterol levels are high, you are over 50, or you are at high risk for heart disease, you may need your cholesterol levels checked more frequently. Ongoing high lipid and cholesterol levels should be treated with medicines if diet and exercise are not working.  . If you smoke, find out from your health care provider how to quit. If you do not use tobacco, please do not start.  . If you choose to drink alcohol, please do not consume more than 2 drinks per day. One drink is considered to be 12 ounces  (355 mL) of beer, 5 ounces (148 mL) of wine, or 1.5 ounces (44 mL) of liquor.  . If you are 28-7 years old, ask your health care provider if you should take aspirin to prevent strokes.  . Use sunscreen. Apply sunscreen liberally and repeatedly throughout the day. You should seek shade when your shadow is shorter than you. Protect yourself by wearing long sleeves, pants, a wide-brimmed hat, and sunglasses year round, whenever you are outdoors.  . Once a month, do a whole body skin exam, using a mirror to look at the skin on your back. Tell your health care provider of new moles, moles that have irregular borders, moles that are larger than a pencil eraser, or moles that have changed in shape or color.

## 2016-10-14 NOTE — Progress Notes (Signed)
Subjective:   Raymond Kirby is a 70 y.o. male who presents for Medicare Annual/Subsequent preventive examination.  Review of Systems:  N/A Cardiac Risk Factors include: advanced age (>6men, >67 women);male gender;obesity (BMI >30kg/m2);dyslipidemia;hypertension     Objective:    Vitals: BP 110/70 (BP Location: Left Arm, Patient Position: Sitting, Cuff Size: Normal)   Pulse 67   Temp 98.1 F (36.7 C) (Oral)   Ht 5\' 10"  (1.778 m)   Wt 206 lb (93.4 kg)   SpO2 97%   BMI 29.56 kg/m   Body mass index is 29.56 kg/m.  Tobacco History  Smoking Status  . Never Smoker  Smokeless Tobacco  . Never Used     Counseling given: No   Past Medical History:  Diagnosis Date  . BPH (benign prostatic hypertrophy) 11/16/1994  . Diverticulosis   . Hyperlipemia 04/17/1995  . Hypertension 11/16/1994   History reviewed. No pertinent surgical history. Family History  Problem Relation Age of Onset  . Cancer Mother     breast CA Mets, recurrent  . Hypertension Father   . Cancer Father     prostate cancer  . Heart disease Father     MI,CABG  . Aneurysm Father   . Prostate cancer Father   . Stroke Father   . Cancer Sister     breast ca  . Colon cancer Neg Hx    History  Sexual Activity  . Sexual activity: Yes    Outpatient Encounter Prescriptions as of 10/14/2016  Medication Sig  . atorvastatin (LIPITOR) 80 MG tablet TAKE ONE TABLET BY MOUTH DAILY  . finasteride (PROSCAR) 5 MG tablet TAKE ONE TABLET BY MOUTH DAILY  . HEMMOREX-HC 25 MG suppository INSER ONE SUPPOSITORY RECTALLY EVERY 12 HOURS (Patient taking differently: INSER ONE SUPPOSITORY RECTALLY EVERY 12 HOURS as needed)  . lisinopril-hydrochlorothiazide (PRINZIDE,ZESTORETIC) 20-12.5 MG tablet TAKE ONE TABLET BY MOUTH EVERY DAY  . omeprazole (PRILOSEC) 20 MG capsule TAKE 1 CAPSULE BY MOUTH DAILY  . HYDROcodone-homatropine (HYCODAN) 5-1.5 MG/5ML syrup Take 5 mLs by mouth every 8 (eight) hours as needed for cough. (Patient  not taking: Reported on 10/14/2016)  . triamcinolone cream (KENALOG) 0.1 % Apply 1 application topically 2 (two) times daily. (Patient not taking: Reported on 10/14/2016)  . [DISCONTINUED] azithromycin (ZITHROMAX) 250 MG tablet Take 500 mg (2 tablets) by mouth today, then take 250 mg (1 tablet) by mouth for the next 4 days   No facility-administered encounter medications on file as of 10/14/2016.     Activities of Daily Living In your present state of health, do you have any difficulty performing the following activities: 10/14/2016  Hearing? N  Vision? N  Difficulty concentrating or making decisions? N  Walking or climbing stairs? N  Dressing or bathing? N  Doing errands, shopping? N  Preparing Food and eating ? N  Using the Toilet? N  In the past six months, have you accidently leaked urine? N  Do you have problems with loss of bowel control? N  Managing your Medications? N  Managing your Finances? N  Housekeeping or managing your Housekeeping? N  Some recent data might be hidden    Patient Care Team: Tonia Ghent, MD as PCP - General (Family Medicine)   Assessment:     Hearing Screening   125Hz  250Hz  500Hz  1000Hz  2000Hz  3000Hz  4000Hz  6000Hz  8000Hz   Right ear:   40 40 40  40    Left ear:   40 40 40  0  Vision Screening Comments: Last vision exam in Sept 2017 with Dr. George Ina   Exercise Activities and Dietary recommendations Current Exercise Habits: The patient does not participate in regular exercise at present, Exercise limited by: None identified  Goals    . Increase water intake          Starting 10/14/2016, I will attempt to drink at least 8 oz water with each meal daily.       Fall Risk Fall Risk  10/14/2016 10/14/2015 10/01/2014 08/18/2013  Falls in the past year? No No No No   Depression Screen PHQ 2/9 Scores 10/14/2016 10/14/2015 10/01/2014 08/18/2013  PHQ - 2 Score 0 0 0 0    Cognitive Function MMSE - Mini Mental State Exam 10/14/2016    Orientation to time 5  Orientation to Place 5  Registration 3  Attention/ Calculation 0  Recall 3  Language- name 2 objects 0  Language- repeat 1  Language- follow 3 step command 3  Language- read & follow direction 0  Write a sentence 0  Copy design 0  Total score 20     PLEASE NOTE: A Mini-Cog screen was completed. Maximum score is 20. A value of 0 denotes this part of Folstein MMSE was not completed or the patient failed this part of the Mini-Cog screening.   Mini-Cog Screening Orientation to Time - Max 5 pts Orientation to Place - Max 5 pts Registration - Max 3 pts Recall - Max 3 pts Language Repeat - Max 1 pts Language Follow 3 Step Command - Max 3 pts     Immunization History  Administered Date(s) Administered  . Influenza Whole 10/23/2009  . Influenza,inj,Quad PF,36+ Mos 08/18/2013, 10/01/2014, 10/14/2015, 10/14/2016  . Pneumococcal Conjugate-13 10/01/2014  . Pneumococcal Polysaccharide-23 08/18/2013  . Td 02/15/1995, 11/11/2005  . Zoster 04/02/2008   Screening Tests Health Maintenance  Topic Date Due  . TETANUS/TDAP  11/10/2025 (Originally 11/12/2015)  . COLONOSCOPY  06/27/2026 (Originally 06/28/2016)  . INFLUENZA VACCINE  Completed  . ZOSTAVAX  Completed  . Hepatitis C Screening  Completed  . PNA vac Low Risk Adult  Completed      Plan:     I have personally reviewed and addressed the Medicare Annual Wellness questionnaire and have noted the following in the patient's chart:  A. Medical and social history B. Use of alcohol, tobacco or illicit drugs  C. Current medications and supplements D. Functional ability and status E.  Nutritional status F.  Physical activity G. Advance directives H. List of other physicians I.  Hospitalizations, surgeries, and ER visits in previous 12 months J.  [place] to include hearing, vision, cognitive, depression L. Referrals and appointments - none  In addition, I have reviewed and discussed with patient  certain preventive protocols, quality metrics, and best practice recommendations. A written personalized care plan for preventive services as well as general preventive health recommendations were provided to patient.  See attached scanned questionnaire for additional information.   Signed,   Lindell Noe, MHA, BS, LPN Health Coach

## 2016-10-15 LAB — HEPATITIS C ANTIBODY: HCV Ab: NEGATIVE

## 2016-10-15 NOTE — Progress Notes (Signed)
I reviewed health advisor's note, was available for consultation, and agree with documentation and plan.  

## 2016-10-19 ENCOUNTER — Ambulatory Visit (INDEPENDENT_AMBULATORY_CARE_PROVIDER_SITE_OTHER)
Admission: RE | Admit: 2016-10-19 | Discharge: 2016-10-19 | Disposition: A | Discharge status: Home / Self Care | Payer: PPO | Source: Ambulatory Visit | Attending: Family Medicine | Admitting: Family Medicine

## 2016-10-19 ENCOUNTER — Encounter: Payer: Self-pay | Admitting: Family Medicine

## 2016-10-19 ENCOUNTER — Ambulatory Visit (INDEPENDENT_AMBULATORY_CARE_PROVIDER_SITE_OTHER): Payer: PPO | Admitting: Family Medicine

## 2016-10-19 VITALS — BP 122/70 | HR 61 | Temp 98.6°F | Ht 70.0 in | Wt 208.8 lb

## 2016-10-19 DIAGNOSIS — R05 Cough: Secondary | ICD-10-CM

## 2016-10-19 DIAGNOSIS — R059 Cough, unspecified: Secondary | ICD-10-CM

## 2016-10-19 DIAGNOSIS — R739 Hyperglycemia, unspecified: Secondary | ICD-10-CM

## 2016-10-19 DIAGNOSIS — Z0000 Encounter for general adult medical examination without abnormal findings: Secondary | ICD-10-CM

## 2016-10-19 DIAGNOSIS — N400 Enlarged prostate without lower urinary tract symptoms: Secondary | ICD-10-CM | POA: Diagnosis not present

## 2016-10-19 DIAGNOSIS — E785 Hyperlipidemia, unspecified: Secondary | ICD-10-CM

## 2016-10-19 DIAGNOSIS — Z136 Encounter for screening for cardiovascular disorders: Secondary | ICD-10-CM

## 2016-10-19 DIAGNOSIS — N529 Male erectile dysfunction, unspecified: Secondary | ICD-10-CM

## 2016-10-19 DIAGNOSIS — I10 Essential (primary) hypertension: Secondary | ICD-10-CM

## 2016-10-19 MED ORDER — HYDROCHLOROTHIAZIDE 12.5 MG PO TABS: 12.5000 mg | ORAL_TABLET | Freq: Every day | ORAL | 3 refills | Status: DC

## 2016-10-19 MED ORDER — OMEPRAZOLE 20 MG PO CPDR: 20.0000 mg | DELAYED_RELEASE_CAPSULE | Freq: Every day | ORAL | 3 refills | Status: DC

## 2016-10-19 MED ORDER — HYDROCORTISONE ACETATE 25 MG RE SUPP: RECTAL | 0 refills | Status: DC

## 2016-10-19 MED ORDER — FINASTERIDE 5 MG PO TABS: 5.0000 mg | ORAL_TABLET | Freq: Every day | ORAL | 3 refills | Status: DC

## 2016-10-19 MED ORDER — ATORVASTATIN CALCIUM 80 MG PO TABS: 80.0000 mg | ORAL_TABLET | Freq: Every day | ORAL | 3 refills | Status: DC

## 2016-10-19 NOTE — Progress Notes (Signed)
Pre visit review using our clinic review tool, if applicable. No additional management support is needed unless otherwise documented below in the visit note. 

## 2016-10-19 NOTE — Progress Notes (Signed)
Flu vaccine - administered prev.   D/w patient [id number] for colon cancer screening, including IFOB vs. Colonoscopy vs cologuard.  Risks and benefits of all were discussed and patient voiced understanding.  Pt elects for: cologuard.   Hep C screening - completed- neg d/w pt.   AAA screening d/w pt.  See orders.   Hearing screen- failed.  Declined hearing aids.  D/w pt.   BPH.  PSA wnl.  Nocturia at baseline, but drinking fluid late at night likely contributing.  No burning with urination.  Stream is fair.  No ADE on med.  D/w pt.    Elevated Cholesterol: Using medications without problems:yes Muscle aches: no Diet compliance: encouraged.  Exercise: encouraged.  Other complaints: labs d/w pt.    Hyperglycemia. Discussed with patient about diet and exercise. Discussed with patient about labs.  Hypertension:    Using medication without problems or lightheadedness:yes, but cough noted.  Consistent dry episodic cough.    Chest pain with exertion:no Edema:no Short of breath:no  Some ED noted.  Unclear if related to BP med.  D/w pt.   Cough.  See above.  No sputum.    PMH and SH reviewed  Meds, vitals, and allergies reviewed.   ROS: Per HPI unless specifically indicated in ROS section   GEN: nad, alert and oriented HEENT: mucous membranes moist NECK: supple w/o LA CV: rrr. PULM: ctab, no inc wob, episodic dry cough noted during the exam. ABD: soft, +bs EXT: no edema SKIN: no acute rash

## 2016-10-19 NOTE — Patient Instructions (Addendum)
Check with your insurance to see if they will cover the tetanus shot; it may be cheaper at the pharmacy.   Go to the lab on the way out.  We'll contact you with your xray report. Rosaria Ferries will call about your referral (AAA screening ultrasound).  See her on the way out.  Stop lisinopril HCTZ.  Change to plain HCTZ.  Update me in about 1 week about your BP and cough.  Take care.  Glad to see you.

## 2016-10-20 DIAGNOSIS — Z0000 Encounter for general adult medical examination without abnormal findings: Secondary | ICD-10-CM | POA: Insufficient documentation

## 2016-10-20 NOTE — Assessment & Plan Note (Signed)
Controlled, but unclear if ACE inhibitor is causing his cough. Stop lisinopril. Continue HCTZ. He will update me with his blood pressure and cough in a few days. We can adjust his medications at that point if needed. Lungs are clear otherwise. Nontoxic. Okay for outpatient follow-up.

## 2016-10-20 NOTE — Assessment & Plan Note (Signed)
PSA normal. Discussed with patient about finasteride effect on PSA. His true level is double his current value. This is still reassuring. No significant change baseline. Likely with some of his nocturia from drinking too much fluid late at night. Discussed with patient about tapering that.  Continue as is otherwise.

## 2016-10-20 NOTE — Assessment & Plan Note (Signed)
Discussed with patient about diet and exercise. Discussed with patient about labs.

## 2016-10-20 NOTE — Assessment & Plan Note (Signed)
Flu vaccine - administered prev.   D/w patient [id number] for colon cancer screening, including IFOB vs. Colonoscopy vs cologuard.  Risks and benefits of all were discussed and patient voiced understanding.  Pt elects for: cologuard.   Hep C screening - completed- neg d/w pt.   AAA screening d/w pt.  See orders.   Hearing screen- failed.  Declined hearing aids.  D/w pt.

## 2016-10-20 NOTE — Assessment & Plan Note (Signed)
Continue statin. Continue work on diet and exercise. Discussed with patient. He agrees.

## 2016-10-20 NOTE — Assessment & Plan Note (Signed)
Unclear if ACE inhibitor is causing his cough. Stop lisinopril. Continue HCTZ. He will update me with his blood pressure and cough in a few days. We can adjust his medications at that point if needed. Lungs are clear otherwise. Nontoxic. Okay for outpatient follow-up. See notes on chest x-ray

## 2016-10-20 NOTE — Assessment & Plan Note (Signed)
Unclear if related to blood pressure medicine. Discussed with patient. We will see how he does with the change in blood pressure medication. See above.

## 2016-10-22 ENCOUNTER — Ambulatory Visit: Payer: PPO

## 2016-10-22 DIAGNOSIS — Z136 Encounter for screening for cardiovascular disorders: Secondary | ICD-10-CM

## 2016-10-27 ENCOUNTER — Encounter: Payer: Self-pay | Admitting: Family Medicine

## 2016-10-27 DIAGNOSIS — Z1211 Encounter for screening for malignant neoplasm of colon: Secondary | ICD-10-CM | POA: Diagnosis not present

## 2016-10-27 DIAGNOSIS — Z1212 Encounter for screening for malignant neoplasm of rectum: Secondary | ICD-10-CM | POA: Diagnosis not present

## 2016-11-02 ENCOUNTER — Telehealth: Payer: Self-pay

## 2016-11-02 MED ORDER — LOSARTAN POTASSIUM 50 MG PO TABS: 50.0000 mg | ORAL_TABLET | Freq: Every day | ORAL | 3 refills | Status: DC

## 2016-11-02 NOTE — Telephone Encounter (Signed)
Glad the cough is better.   Would still continue HCTZ.  Add on losartan.  It does come as a combination, but I would like to get his BP regulated before we try to combine the meds.   Start losartan 50mg  a day.  If BP still >140/>90 after 10 days, then inc to 100mg  a day.  Update me later on as needed.  Thanks.

## 2016-11-02 NOTE — Telephone Encounter (Signed)
Pt left note;Pt last seen 10/19/16 and stopped lisinopril; cough has stopped but pt had BP checked on 10/30/16 at 4:30 pm and BP 160/92 P 80 and on 10/31/16 at 10:30 BP was 162/92 P 82. Pt said Robin pharmacist at [place] suggested [place].  Pt not having any symptoms;no H/A, dizziness, CP or SOB. Pt takes the HCTZ at night. Pt request cb after Dr Damita Dunnings reviews.[facility].

## 2016-11-02 NOTE — Telephone Encounter (Signed)
Patient advised.

## 2016-11-03 LAB — COLOGUARD

## 2016-11-04 ENCOUNTER — Telehealth: Payer: Self-pay

## 2016-11-04 DIAGNOSIS — Z1211 Encounter for screening for malignant neoplasm of colon: Secondary | ICD-10-CM

## 2016-11-04 NOTE — Telephone Encounter (Signed)
Patient notified as instructed by telephone and verbalized understanding. Patient stated that he is okay to go to [place], but would like this set up after the first of the year. Advised patient that one of the referral coordinators will be in touch to get this set up for him.

## 2016-11-04 NOTE — Telephone Encounter (Signed)
Hadn't seen report yet.  Refer to GI.  Notify pt.  Thanks.

## 2016-11-04 NOTE — Telephone Encounter (Signed)
[facility] wanted to verify received cologuard report; pt had positive test result. cb to [facility] if did not get results.

## 2016-11-10 ENCOUNTER — Encounter: Payer: Self-pay | Admitting: Gastroenterology

## 2016-11-30 ENCOUNTER — Ambulatory Visit (AMBULATORY_SURGERY_CENTER): Payer: Self-pay

## 2016-11-30 VITALS — Ht 70.0 in | Wt 213.4 lb

## 2016-11-30 DIAGNOSIS — R195 Other fecal abnormalities: Secondary | ICD-10-CM

## 2016-11-30 MED ORDER — SUPREP BOWEL PREP KIT 17.5-3.13-1.6 GM/177ML PO SOLN: 1.0000 | Freq: Once | ORAL | 0 refills | Status: AC

## 2016-11-30 NOTE — Progress Notes (Signed)
No allergies to eggs or soy No past problems with anesthesia No home oxygen No diet meds  Declined emmi 

## 2016-12-02 ENCOUNTER — Encounter: Payer: Self-pay | Admitting: Gastroenterology

## 2016-12-14 ENCOUNTER — Encounter: Payer: Self-pay | Admitting: Gastroenterology

## 2016-12-14 ENCOUNTER — Ambulatory Visit (AMBULATORY_SURGERY_CENTER): Payer: PPO | Admitting: Gastroenterology

## 2016-12-14 VITALS — BP 149/73 | HR 72 | Temp 98.6°F | Resp 15 | Ht 70.0 in | Wt 213.0 lb

## 2016-12-14 DIAGNOSIS — D122 Benign neoplasm of ascending colon: Secondary | ICD-10-CM

## 2016-12-14 DIAGNOSIS — D123 Benign neoplasm of transverse colon: Secondary | ICD-10-CM | POA: Diagnosis not present

## 2016-12-14 DIAGNOSIS — I10 Essential (primary) hypertension: Secondary | ICD-10-CM | POA: Diagnosis not present

## 2016-12-14 DIAGNOSIS — D125 Benign neoplasm of sigmoid colon: Secondary | ICD-10-CM

## 2016-12-14 DIAGNOSIS — K635 Polyp of colon: Secondary | ICD-10-CM

## 2016-12-14 DIAGNOSIS — D126 Benign neoplasm of colon, unspecified: Secondary | ICD-10-CM

## 2016-12-14 DIAGNOSIS — K219 Gastro-esophageal reflux disease without esophagitis: Secondary | ICD-10-CM | POA: Diagnosis not present

## 2016-12-14 DIAGNOSIS — R195 Other fecal abnormalities: Secondary | ICD-10-CM

## 2016-12-14 DIAGNOSIS — D120 Benign neoplasm of cecum: Secondary | ICD-10-CM | POA: Diagnosis not present

## 2016-12-14 MED ORDER — SODIUM CHLORIDE 0.9 % IV SOLN: 500.0000 mL | INTRAVENOUS | Status: DC

## 2016-12-14 NOTE — Progress Notes (Signed)
Called to room to assist during endoscopic procedure.  Patient ID and intended procedure confirmed with present staff. Received instructions for my participation in the procedure from the performing physician.  

## 2016-12-14 NOTE — Progress Notes (Signed)
Report to PACU, RN, vss, BBS= Clear.  

## 2016-12-14 NOTE — Patient Instructions (Signed)
Impression/Recommendations:  Hemorrhoid handout given to patient. Polyp handout given to patient. Diverticulosis handout given to patient.  No Ibuprofen, naproxen, or other NSAID drugs for 2 weeks following polyp removal.   Tylenol only until Feb. 13, 2018.  Daily fiber supplement for hemorrhoids. Trial of Anusol suppository for hemorrhoids.  Follow up in the clinic for hemorrhoid banding.  Repeat colonoscopy recommended for surveillance.  Date to be determined after pathology results reviewed.  YOU HAD AN ENDOSCOPIC PROCEDURE TODAY AT [facility]:   Refer to the procedure report that was given to you for any specific questions about what was found during the examination.  If the procedure report does not answer your questions, please call your gastroenterologist to clarify.  If you requested that your care partner not be given the details of your procedure findings, then the procedure report has been included in a sealed envelope for you to review at your convenience later.  YOU SHOULD EXPECT: Some feelings of bloating in the abdomen. Passage of more gas than usual.  Walking can help get rid of the air that was put into your GI tract during the procedure and reduce the bloating. If you had a lower endoscopy (such as a colonoscopy or flexible sigmoidoscopy) you may notice spotting of blood in your stool or on the toilet paper. If you underwent a bowel prep for your procedure, you may not have a normal bowel movement for a few days.  Please Note:  You might notice some irritation and congestion in your nose or some drainage.  This is from the oxygen used during your procedure.  There is no need for concern and it should clear up in a day or so.  SYMPTOMS TO REPORT IMMEDIATELY:   Following lower endoscopy (colonoscopy or flexible sigmoidoscopy):  Excessive amounts of blood in the stool  Significant tenderness or worsening of abdominal pains  Swelling of the abdomen that is  new, acute  Fever of 100F or higher   For urgent or emergent issues, a gastroenterologist can be reached at any hour by calling [phone].   DIET:  We do recommend a small meal at first, but then you may proceed to your regular diet.  Drink plenty of fluids but you should avoid alcoholic beverages for 24 hours.  ACTIVITY:  You should plan to take it easy for the rest of today and you should NOT DRIVE or use heavy machinery until tomorrow (because of the sedation medicines used during the test).    FOLLOW UP: Our staff will call the number listed on your records the next business day following your procedure to check on you and address any questions or concerns that you may have regarding the information given to you following your procedure. If we do not reach you, we will leave a message.  However, if you are feeling well and you are not experiencing any problems, there is no need to return our call.  We will assume that you have returned to your regular daily activities without incident.  If any biopsies were taken you will be contacted by phone or by letter within the next 1-3 weeks.  Please call us at [phone] if you have not heard about the biopsies in 3 weeks.    SIGNATURES/CONFIDENTIALITY: You and/or your care partner have signed paperwork which will be entered into your electronic medical record.  These signatures attest to the fact that that the information above on your After Visit Summary has been  reviewed and is understood.  Full responsibility of the confidentiality of this discharge information lies with you and/or your care-partner. 

## 2016-12-14 NOTE — Op Note (Signed)
[facility] Patient Name: Raymond Kirby Procedure Date: 12/14/2016 1:57 PM MRN: [id number] Endoscopist: Remo Lipps P. Raeghan Demeter MD, MD Age: 71 Referring MD:  Date of Birth: Jan 24, 1946 Gender: Male Account #: [id number] Procedure:                Colonoscopy Indications:              Positive Cologuard test Medicines:                Monitored Anesthesia Care Procedure:                Pre-Anesthesia Assessment:                           - Prior to the procedure, a History and Physical                            was performed, and patient medications and                            allergies were reviewed. The patient's tolerance of                            previous anesthesia was also reviewed. The risks                            and benefits of the procedure and the sedation                            options and risks were discussed with the patient.                            All questions were answered, and informed consent                            was obtained. Prior Anticoagulants: The patient has                            taken no previous anticoagulant or antiplatelet                            agents. ASA Grade Assessment: II - A patient with                            mild systemic disease. After reviewing the risks                            and benefits, the patient was deemed in                            satisfactory condition to undergo the procedure.                           After obtaining informed consent, the colonoscope  was passed under direct vision. Throughout the                            procedure, the patient's blood pressure, pulse, and                            oxygen saturations were monitored continuously. The                            Model CF-HQ190L [id number]) scope was introduced                            through the anus and advanced to the the cecum,                            identified by appendiceal orifice  and ileocecal                            valve. The colonoscopy was performed without                            difficulty. The patient tolerated the procedure                            well. The quality of the bowel preparation was                            good. The ileocecal valve, appendiceal orifice, and                            rectum were photographed. Scope In: 2:00:50 PM Scope Out: 2:14:57 PM Scope Withdrawal Time: 0 hours 9 minutes 37 seconds  Total Procedure Duration: 0 hours 14 minutes 7 seconds  Findings:                 The perianal exam findings include non-thrombosed                            external hemorrhoids.                           Two sessile polyps were found in the cecum. The                            polyps were 3 to 4 mm in size. These polyps were                            removed with a cold snare. Resection and retrieval                            were complete.                           A 4 mm polyp was found in the ascending colon. The  polyp was sessile. The polyp was removed with a                            cold snare. Resection and retrieval were complete.                           A 3 mm polyp was found in the splenic flexure. The                            polyp was sessile. The polyp was removed with a                            cold snare. Resection and retrieval were complete.                           A 6 mm polyp was found in the sigmoid colon. The                            polyp was pedunculated. The polyp was removed with                            a hot snare. Resection and retrieval were complete.                           Multiple medium-mouthed diverticula were found in                            the left colon.                           Internal hemorrhoids were found during                            retroflexion. The hemorrhoids were large.                           The exam was otherwise without  abnormality. Complications:            No immediate complications. Estimated blood loss:                            Minimal. Estimated Blood Loss:     Estimated blood loss was minimal. Impression:               - Non-thrombosed external hemorrhoids found on                            perianal exam.                           - Two 3 to 4 mm polyps in the cecum, removed with a                            cold snare. Resected and retrieved.                           -  One 4 mm polyp in the ascending colon, removed                            with a cold snare. Resected and retrieved.                           - One 3 mm polyp at the splenic flexure, removed                            with a cold snare. Resected and retrieved.                           - One 6 mm polyp in the sigmoid colon, removed with                            a hot snare. Resected and retrieved.                           - Diverticulosis in the left colon.                           - Internal hemorrhoids.                           - The examination was otherwise normal. Recommendation:           - Patient has a contact number available for                            emergencies. The signs and symptoms of potential                            delayed complications were discussed with the                            patient. Return to normal activities tomorrow.                            Written discharge instructions were provided to the                            patient.                           - Resume previous diet.                           - Continue present medications.                           - No ibuprofen, naproxen, or other non-steroidal                            anti-inflammatory drugs for 2 weeks after polyp  removal.                           - Await pathology results.                           - Repeat colonoscopy is recommended for                            surveillance. The  colonoscopy date will be                            determined after pathology results from today's                            exam become available for review.                           - Daily fiber supplement for hemorrhoids                           - Trial of anusol supossitory daily for hemorrhoids                           - Follow up in the clinic for hemorrhoid banding                            given symptoms discussed prior to the procedure Mailin Coglianese P. Aarian Griffie MD, MD 12/14/2016 2:22:20 PM This report has been signed electronically.

## 2016-12-15 ENCOUNTER — Telehealth: Payer: Self-pay

## 2016-12-15 NOTE — Telephone Encounter (Signed)
  Follow up Call-  Call back number 12/14/2016  Post procedure Call Back phone  # [phone]  Permission to leave phone message Yes  Some recent data might be hidden     Patient questions:  Do you have a fever, pain , or abdominal swelling? No. Pain Score  0 *  Have you tolerated food without any problems? Yes.    Have you been able to return to your normal activities? Yes.    Do you have any questions about your discharge instructions: Diet   No. Medications  No. Follow up visit  No.  Do you have questions or concerns about your Care? No.  Actions: * If pain score is 4 or above: No action needed, pain <4.

## 2016-12-15 NOTE — Telephone Encounter (Signed)
Patient is scheduled for 01/05/17 for 1st hemorrhoid banding.

## 2016-12-15 NOTE — Telephone Encounter (Signed)
-----   Message from Manus Gunning, MD sent at 12/14/2016  4:28 PM EST ----- Raymond Kirby, This patient had large hemorrhoids on colonoscopy today and was requesting banding. Can you please coordinate with him for first available opening for banding slot? Thanks

## 2016-12-18 ENCOUNTER — Encounter: Payer: Self-pay | Admitting: Gastroenterology

## 2016-12-21 ENCOUNTER — Telehealth: Payer: Self-pay

## 2016-12-21 MED ORDER — AMLODIPINE BESYLATE 2.5 MG PO TABS: 2.5000 mg | ORAL_TABLET | Freq: Every day | ORAL | 3 refills | Status: DC

## 2016-12-21 MED ORDER — LOSARTAN POTASSIUM-HCTZ 100-12.5 MG PO TABS: 1.0000 | ORAL_TABLET | Freq: Every day | ORAL | 3 refills | Status: DC

## 2016-12-21 NOTE — Telephone Encounter (Signed)
Change losartan/hctz to combination pill- no change in dose overall. Make the change when done with current losartan pills.   Also add on amlodipine 2.5mg  a day.   Both rx sent.  If BP still >140/>90 in 10 days, then inc amlodipine to 5mg  a day and update me as needed.  Thanks.

## 2016-12-21 NOTE — Telephone Encounter (Signed)
Pt left v/m; last seen annual 10/19/16. Pt has been taking losartan 100 mg and HCTZ 12.5 mg daily. On 12/14/16 BP 155/91 and today BP 150/92. Pt is starting to run low on losartan and wants to know if should continue same regimen or what would you change? [facility].

## 2016-12-21 NOTE — Telephone Encounter (Signed)
Mr. Sommerfeld notified as instructed by telephone.

## 2017-01-04 ENCOUNTER — Ambulatory Visit (INDEPENDENT_AMBULATORY_CARE_PROVIDER_SITE_OTHER): Payer: PPO | Admitting: Family Medicine

## 2017-01-04 ENCOUNTER — Encounter: Payer: Self-pay | Admitting: Family Medicine

## 2017-01-04 DIAGNOSIS — R6889 Other general symptoms and signs: Secondary | ICD-10-CM

## 2017-01-04 MED ORDER — OSELTAMIVIR PHOSPHATE 75 MG PO CAPS: 75.0000 mg | ORAL_CAPSULE | Freq: Two times a day (BID) | ORAL | 0 refills | Status: DC

## 2017-01-04 NOTE — Progress Notes (Signed)
Pre visit review using our clinic review tool, if applicable. No additional management support is needed unless otherwise documented below in the visit note. 

## 2017-01-04 NOTE — Progress Notes (Signed)
Sx started yesterday.  Came on fairly quickly.  Fever. Aches. some cough.  No vomiting, no diarrhea.  No rash.    Meds, vitals, and allergies reviewed.   ROS: Per HPI unless specifically indicated in ROS section   GEN: nad, alert and oriented HEENT: mucous membranes moist, tm w/o erythema, nasal exam w/o erythema, clear discharge noted,  OP with cobblestoning NECK: supple w/o LA CV: rrr.   PULM: ctab, no inc wob EXT: no edema SKIN: no acute rash

## 2017-01-04 NOTE — Patient Instructions (Signed)
Presumed flu.  Start tamiflu and get some rest.  Update [place] as needed.  Take care.  Glad to see you.

## 2017-01-05 ENCOUNTER — Encounter: Payer: PPO | Admitting: Gastroenterology

## 2017-01-05 DIAGNOSIS — R6889 Other general symptoms and signs: Secondary | ICD-10-CM | POA: Insufficient documentation

## 2017-01-05 NOTE — Assessment & Plan Note (Signed)
Presumed flu.  Start tamiflu and get some rest.  Drink plenty of fluids. Nontoxic. Okay for outpatient follow-up. Discussed with patient. He agrees. Flu test not needed as it would likely not change management at this point. He agrees.

## 2017-01-11 ENCOUNTER — Encounter: Payer: PPO | Admitting: Gastroenterology

## 2017-01-14 ENCOUNTER — Ambulatory Visit (INDEPENDENT_AMBULATORY_CARE_PROVIDER_SITE_OTHER): Payer: PPO | Admitting: Gastroenterology

## 2017-01-14 ENCOUNTER — Encounter: Payer: Self-pay | Admitting: Gastroenterology

## 2017-01-14 VITALS — BP 146/70 | HR 80 | Ht 69.75 in | Wt 209.5 lb

## 2017-01-14 DIAGNOSIS — K641 Second degree hemorrhoids: Secondary | ICD-10-CM | POA: Diagnosis not present

## 2017-01-14 NOTE — Progress Notes (Signed)
PROCEDURE NOTE: The patient presents with symptomatic grade II  hemorrhoids, requesting rubber band ligation of his/her hemorrhoidal disease.  All risks, benefits and alternative forms of therapy were described and informed consent was obtained.  In the Left Lateral Decubitus position anoscopic examination revealed grade II hemorrhoids in all position(s).  The anorectum was pre-medicated with 0.125% nitroglycerin The decision was made to band the RP internal hemorrhoid, and the [place] was used to perform band ligation without complication.  Digital anorectal examination was then performed to assure proper positioning of the band, and to adjust the banded tissue as required.  The patient was discharged home without pain or other issues.  Dietary and behavioral recommendations were given and along with follow-up instructions.     The following adjunctive treatments were recommended: Daily fiber supplements  The patient will return in 2-4 weeks for  follow-up and possible additional banding as required. No complications were encountered and the patient tolerated the procedure well.  Ulster Cellar, MD [facility] Pager [phone]

## 2017-01-14 NOTE — Patient Instructions (Addendum)
If you are age 70 or older, your body mass index should be between 23-30. Your Body mass index is 30.28 kg/m. If this is out of the aforementioned range listed, please consider follow up with your Primary Care Provider.  If you are age 16 or younger, your body mass index should be between 19-25. Your Body mass index is 30.28 kg/m. If this is out of the aformentioned range listed, please consider follow up with your Primary Care Provider.   HEMORRHOID BANDING PROCEDURE    FOLLOW-UP CARE   1. The procedure you have had should have been relatively painless since the banding of the area involved does not have nerve endings and there is no pain sensation.  The rubber band cuts off the blood supply to the hemorrhoid and the band may fall off as soon as 48 hours after the banding (the band may occasionally be seen in the toilet bowl following a bowel movement). You may notice a temporary feeling of fullness in the rectum which should respond adequately to plain Tylenol or Motrin.  2. Following the banding, avoid strenuous exercise that evening and resume full activity the next day.  A sitz bath (soaking in a warm tub) or bidet is soothing, and can be useful for cleansing the area after bowel movements.     3. To avoid constipation, take two tablespoons of natural wheat bran, natural oat bran, flax, Benefiber or any over the counter fiber supplement and increase your water intake to 7-8 glasses daily.    4. Unless you have been prescribed anorectal medication, do not put anything inside your rectum for two weeks: No suppositories, enemas, fingers, etc.  5. Occasionally, you may have more bleeding than usual after the banding procedure.  This is often from the untreated hemorrhoids rather than the treated one.  Don't be concerned if there is a tablespoon or so of blood.  If there is more blood than this, lie flat with your bottom higher than your head and apply an ice pack to the area. If the bleeding  does not stop within a half an hour or if you feel faint, call our office at [phone] or go to the emergency room.  6. Problems are not common; however, if there is a substantial amount of bleeding, severe pain, chills, fever or difficulty passing urine (very rare) or other problems, you should call us at [phone] or report to the nearest emergency room.  7. Do not stay seated continuously for more than 2-3 hours for a day or two after the procedure.  Tighten your buttock muscles 10-15 times every two hours and take 10-15 deep breaths every 1-2 hours.  Do not spend more than a few minutes on the toilet if you cannot empty your bowel; instead re-visit the toilet at a later time.   You have been scheduled for a follow up on Tuesday, March 27th at 4:00pm  Thank you.

## 2017-01-28 DIAGNOSIS — H40051 Ocular hypertension, right eye: Secondary | ICD-10-CM | POA: Diagnosis not present

## 2017-02-09 ENCOUNTER — Encounter: Payer: PPO | Admitting: Gastroenterology

## 2017-03-23 IMAGING — DX DG CHEST 2V
2 series · 2 of 2 positions shown · non-contrast
Comparison: No recent prior.

CLINICAL DATA: Cough.

EXAM:
CHEST  2 VIEW

[chest pa]
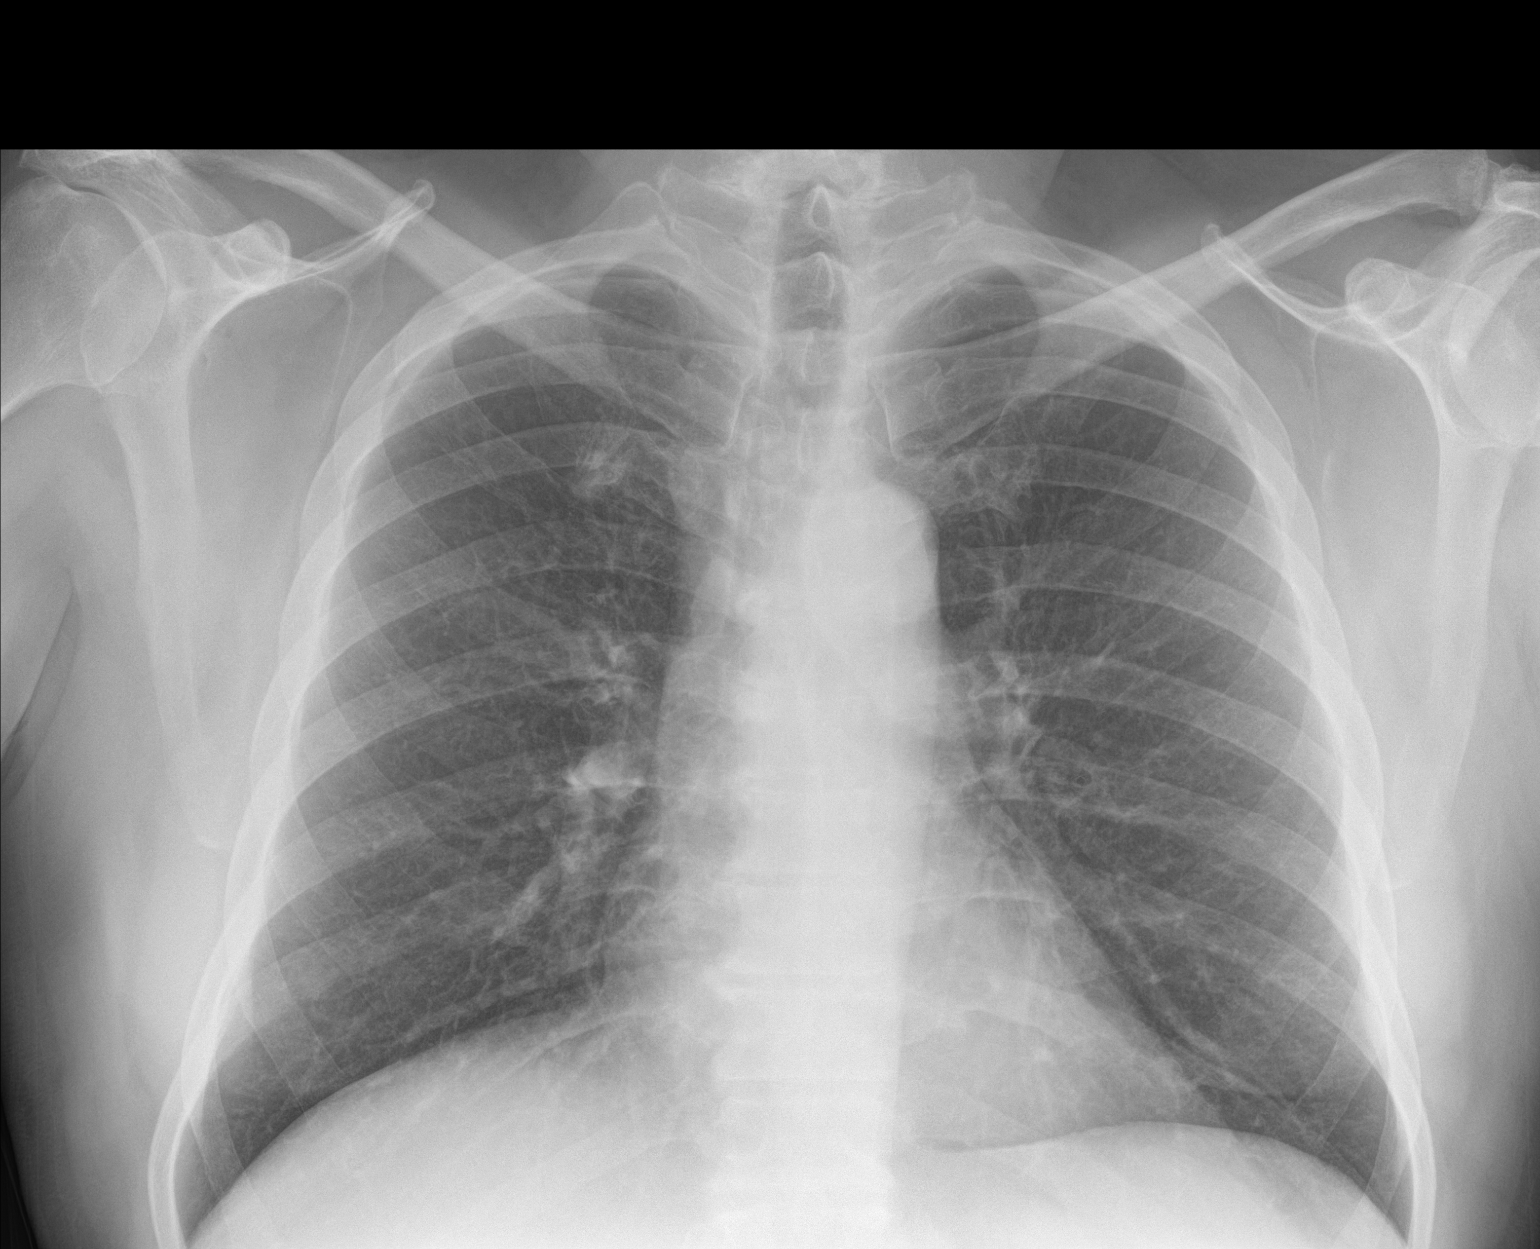

[chest lat]
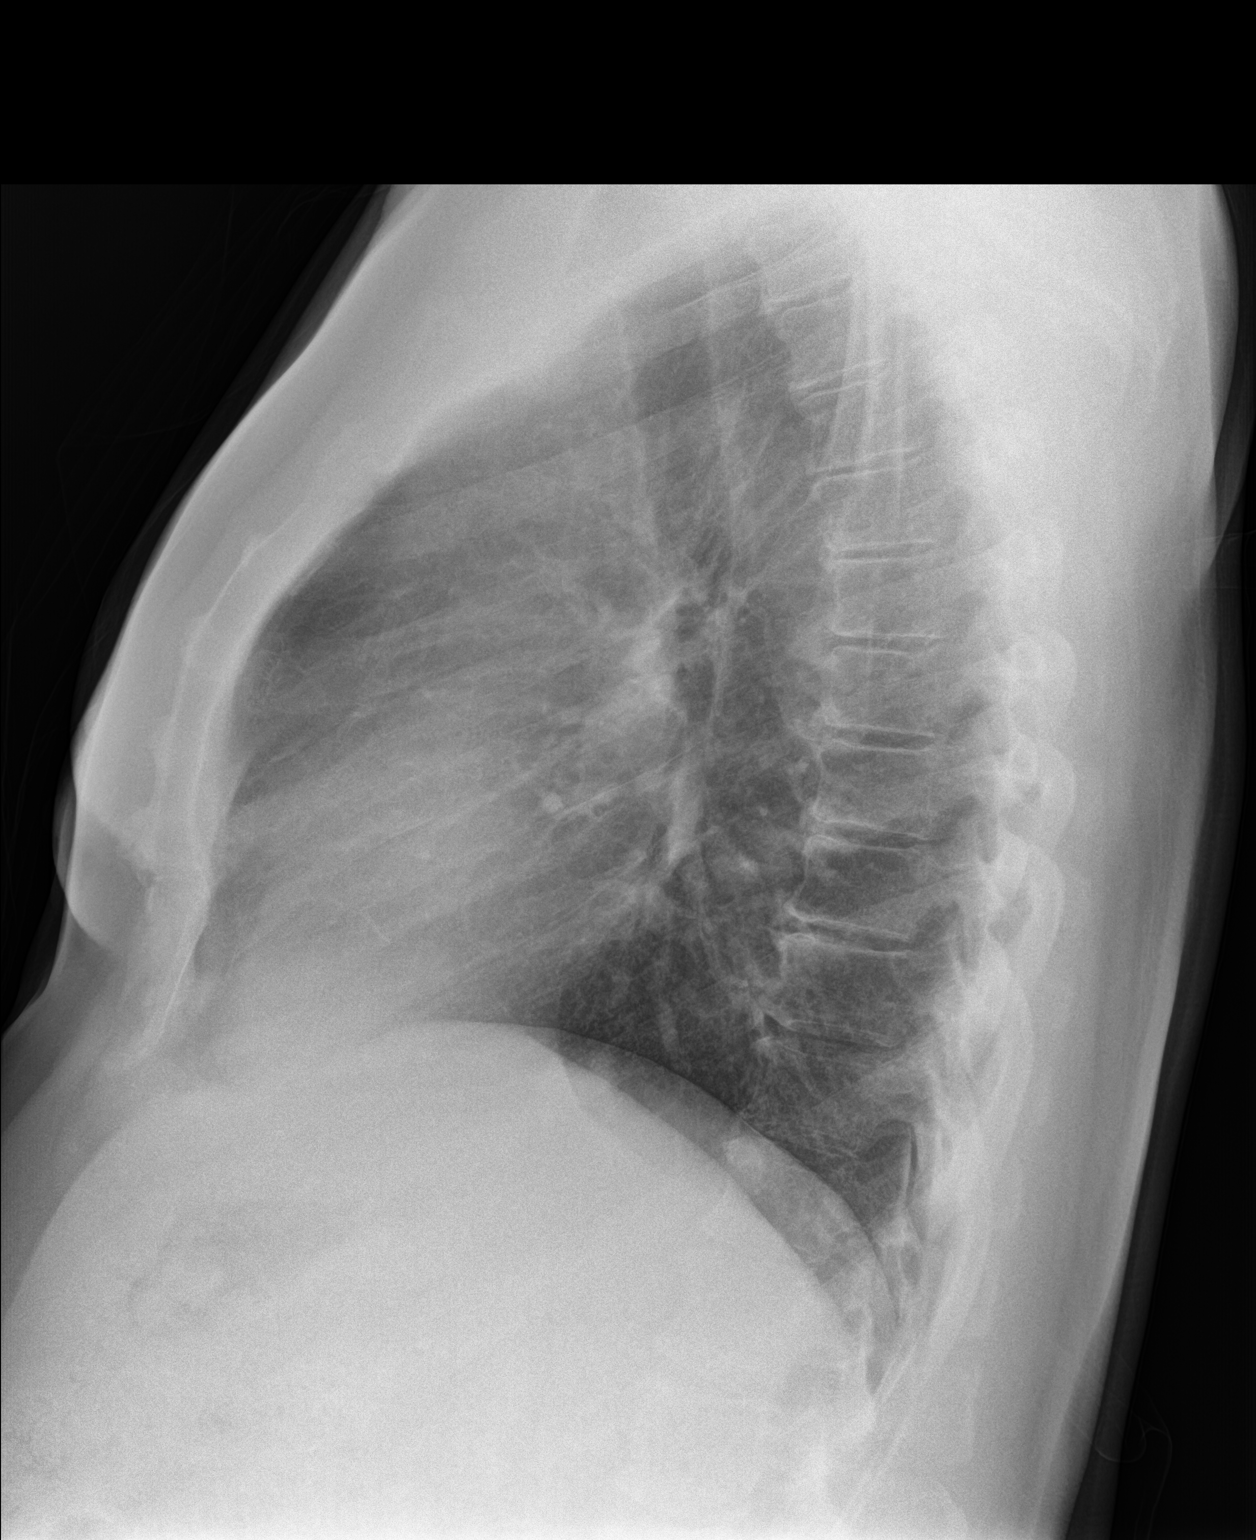

[2 of 2 positions shown; findings below may reference images not displayed]

FINDINGS: Mediastinum hilar structures normal. Lungs are clear. Heart size
normal. No pleural effusion or pneumothorax. Degenerative changes
thoracic spine. Degenerative changes both shoulders.
IMPRESSION: No acute cardiopulmonary disease.

## 2017-07-28 DIAGNOSIS — H2513 Age-related nuclear cataract, bilateral: Secondary | ICD-10-CM | POA: Diagnosis not present

## 2017-08-11 ENCOUNTER — Other Ambulatory Visit: Payer: Self-pay | Admitting: Family Medicine

## 2017-09-02 ENCOUNTER — Other Ambulatory Visit: Payer: Self-pay | Admitting: Family Medicine

## 2017-09-15 ENCOUNTER — Ambulatory Visit: Payer: Self-pay | Admitting: *Deleted

## 2017-09-15 ENCOUNTER — Telehealth: Payer: Self-pay

## 2017-09-15 DIAGNOSIS — S61212A Laceration without foreign body of right middle finger without damage to nail, initial encounter: Secondary | ICD-10-CM | POA: Diagnosis not present

## 2017-09-15 DIAGNOSIS — Z23 Encounter for immunization: Secondary | ICD-10-CM | POA: Diagnosis not present

## 2017-09-15 NOTE — Telephone Encounter (Signed)
Pt  Wife   Called   Stating  Her  Husband  Injured  His  Finger  bad    While   Cutting  Trees   He   Was  Probably  Using a  Chainsaw  She  States  He  Is  In  The    [place]   And  Is  At least  40  mins  To  [employer] .   Again she  Is   Not  With the  Pt  She  Is  Only  describing the  Wound  As   Bad .   She  Was  Advised  To  Have  Him  Apply  diresct  Pressure  With a  Dressing  And  Proceed  To  The  Nearest  Er . Reason for Disposition . Sounds like a serious injury to the triager  Answer Assessment - Initial Assessment Questions 1. MECHANISM: "How did the injury happen?"      Cutting wood 2. ONSET: "When did the injury happen?" (Minutes or hours ago)       approx   1  Hour  Ago  3. LOCATION: "What part of the finger is injured?" "Is the nail damaged?"       unknown 4. APPEARANCE of the INJURY: "What does the injury look like?"       Wife  Was  On the  Phone  With  Her husband  And  He   Described  It  As  Bad   5. SEVERITY: "Can you use the hand normally?"  "Can you bend your fingers into a ball and then fully open them?"    Unknown   6. SIZE: For cuts, bruises, or swelling, ask: "How large is it?" (e.g., inches or centimeters;  entire finger)      unknown 7. PAIN: "Is there pain?" If so, ask: "How bad is the pain?"    (e.g., Scale 1-10; or mild, moderate, severe)     unknown 8. TETANUS: For any breaks in the skin, ask: "When was the last tetanus booster?"     unknown 9. OTHER SYMPTOMS: "Do you have any other symptoms?"     no 10. PREGNANCY: "Is there any chance you are pregnant?" "When was your last menstrual period?"       unknown  Protocols used: FINGER INJURY-A-AH

## 2017-09-15 NOTE — Telephone Encounter (Signed)
Patient walks in with wound to left middle finger.  Upon assessment this nurse identifies a deep laceration that will require stitches for closure and patient in need for a tetanus shot.  Patient requests to be seen here for treatment and does not want to go to the ER.  I explained to patient that it is 4:30pm, and have no openings with providers.  In addition, we are not a walk-in clinic and cannot provide the medical treatment that he needs at the present time.  Bleeding is currently controlled and patient feels fine otherwise.  I have directed patient to urgent care in [place] for further eval and treatment.  Patient does not need emergency room, should be able to be managed appropriately in an acute care setting.    Patient verbalizes understanding and agrees to go to walk-in.

## 2017-09-15 NOTE — Telephone Encounter (Signed)
Pt came in office; see nurse triage encounter by [facility] RN team Lead.

## 2017-09-15 NOTE — Telephone Encounter (Signed)
Brendia Sacks RN also noted: Pt Wife  Reports He Sustained A Bad Injury to finger While cutting trees She Is Relaying info From her Husband  He is approx 40 mins From Office And It is 4 pm when she called She Was Advised to tell Him to apply pressure And Proceed to ED  (Routing comment)

## 2017-09-16 NOTE — Telephone Encounter (Signed)
Agreed, I was not in clinic on the day of the event.

## 2017-10-28 ENCOUNTER — Other Ambulatory Visit (INDEPENDENT_AMBULATORY_CARE_PROVIDER_SITE_OTHER): Payer: PPO

## 2017-10-28 ENCOUNTER — Ambulatory Visit: Payer: PPO | Admitting: Family Medicine

## 2017-10-28 ENCOUNTER — Encounter: Payer: Self-pay | Admitting: Family Medicine

## 2017-10-28 ENCOUNTER — Other Ambulatory Visit: Payer: Self-pay | Admitting: Family Medicine

## 2017-10-28 DIAGNOSIS — R739 Hyperglycemia, unspecified: Secondary | ICD-10-CM

## 2017-10-28 DIAGNOSIS — I10 Essential (primary) hypertension: Secondary | ICD-10-CM

## 2017-10-28 DIAGNOSIS — Z125 Encounter for screening for malignant neoplasm of prostate: Secondary | ICD-10-CM | POA: Diagnosis not present

## 2017-10-28 LAB — PSA, MEDICARE: PSA: 1.41 ng/ml (ref 0.10–4.00)

## 2017-10-28 LAB — COMPREHENSIVE METABOLIC PANEL
ALT: 19 U/L (ref 0–53)
AST: 17 U/L (ref 0–37)
Albumin: 4.3 g/dL (ref 3.5–5.2)
Alkaline Phosphatase: 53 U/L (ref 39–117)
BUN: 13 mg/dL (ref 6–23)
CHLORIDE: 104 meq/L (ref 96–112)
CO2: 30 meq/L (ref 19–32)
CREATININE: 1.02 mg/dL (ref 0.40–1.50)
Calcium: 9.9 mg/dL (ref 8.4–10.5)
GFR: 76.72 mL/min (ref >60.00)
Glucose, Bld: 127 mg/dL — ABNORMAL HIGH (ref 70–99)
Potassium: 4.1 mEq/L (ref 3.5–5.1)
SODIUM: 140 meq/L (ref 135–145)
Total Bilirubin: 0.4 mg/dL (ref 0.2–1.2)
Total Protein: 6.9 g/dL (ref 6.0–8.3)

## 2017-10-28 LAB — HEMOGLOBIN A1C: Hgb A1c MFr Bld: 6.5 % (ref 4.6–6.5)

## 2017-10-28 LAB — LIPID PANEL
CHOL/HDL RATIO: 3
CHOLESTEROL: 148 mg/dL (ref 0–200)
HDL: 52.4 mg/dL (ref >39.00)
LDL CALC: 74 mg/dL (ref 0–99)
NonHDL: 95.4
Triglycerides: 107 mg/dL (ref 0.0–149.0)
VLDL: 21.4 mg/dL (ref 0.0–40.0)

## 2017-11-03 ENCOUNTER — Ambulatory Visit (INDEPENDENT_AMBULATORY_CARE_PROVIDER_SITE_OTHER): Payer: PPO | Admitting: Family Medicine

## 2017-11-03 ENCOUNTER — Encounter: Payer: Self-pay | Admitting: Family Medicine

## 2017-11-03 ENCOUNTER — Ambulatory Visit: Payer: PPO | Admitting: Family Medicine

## 2017-11-03 VITALS — BP 124/80 | HR 64 | Temp 98.6°F | Ht 70.0 in | Wt 213.5 lb

## 2017-11-03 DIAGNOSIS — Z0000 Encounter for general adult medical examination without abnormal findings: Secondary | ICD-10-CM

## 2017-11-03 DIAGNOSIS — E119 Type 2 diabetes mellitus without complications: Secondary | ICD-10-CM | POA: Diagnosis not present

## 2017-11-03 DIAGNOSIS — E785 Hyperlipidemia, unspecified: Secondary | ICD-10-CM | POA: Diagnosis not present

## 2017-11-03 DIAGNOSIS — N400 Enlarged prostate without lower urinary tract symptoms: Secondary | ICD-10-CM | POA: Diagnosis not present

## 2017-11-03 DIAGNOSIS — I10 Essential (primary) hypertension: Secondary | ICD-10-CM

## 2017-11-03 DIAGNOSIS — Z0001 Encounter for general adult medical examination with abnormal findings: Secondary | ICD-10-CM | POA: Diagnosis not present

## 2017-11-03 DIAGNOSIS — Z7189 Other specified counseling: Secondary | ICD-10-CM

## 2017-11-03 MED ORDER — FINASTERIDE 5 MG PO TABS: 5.0000 mg | ORAL_TABLET | Freq: Every day | ORAL | 3 refills | Status: DC

## 2017-11-03 MED ORDER — OMEPRAZOLE 20 MG PO CPDR: 20.0000 mg | DELAYED_RELEASE_CAPSULE | Freq: Every day | ORAL | 3 refills | Status: DC

## 2017-11-03 MED ORDER — ATORVASTATIN CALCIUM 80 MG PO TABS: 80.0000 mg | ORAL_TABLET | Freq: Every day | ORAL | 3 refills | Status: DC

## 2017-11-03 MED ORDER — AMLODIPINE BESYLATE 2.5 MG PO TABS: 2.5000 mg | ORAL_TABLET | Freq: Every day | ORAL | 3 refills | Status: DC

## 2017-11-03 MED ORDER — LOSARTAN POTASSIUM-HCTZ 100-12.5 MG PO TABS: 1.0000 | ORAL_TABLET | Freq: Every day | ORAL | 3 refills | Status: DC

## 2017-11-03 NOTE — Progress Notes (Signed)
I have personally reviewed the Medicare Annual Wellness questionnaire and have noted 1. The patient's medical and social history 2. Their use of alcohol, tobacco or illicit drugs 3. Their current medications and supplements 4. The patient's functional ability including ADL's, fall risks, home safety risks and hearing or visual             impairment. 5. Diet and physical activities 6. Evidence for depression or mood disorders  The patients weight, height, BMI have been recorded in the chart and visual acuity is per eye clinic.  I have made referrals, counseling and provided education to the patient based review of the above and I have provided the pt with a written personalized care plan for preventive services.  Provider list updated- see scanned forms.  Routine anticipatory guidance given to patient.  See health maintenance. The possibility exists that previously documented standard health maintenance information may have been brought forward from a previous encounter into this note.  If needed, that same information has been updated to reflect the current situation based on today's encounter.    Flu 2018 Shingles 2009 PNA 2015 Tetanus 2018, d/w pt.  Colonoscopy 2018 Hep C screening - completed prev- neg d/w pt.   AAA screening prev neg PSA 2018, wnl Advance directive- wife designated if patient were incapacitated.  Cognitive function addressed- see scanned forms- and if abnormal then additional documentation follows.   DM2.  New dx. A1c discussed with patient.  Diabetes pathophysiology discussed with patient.  See plan.  Hypertension:    Using medication without problems or lightheadedness:  yes Chest pain with exertion:no Edema:no Short of breath:no Labs d/w pt.    Elevated Cholesterol: Using medications without problems:yes Muscle aches: no Diet compliance: encouraged.  Exercise:encouraged Labs d/w pt.   Walking about 2 miles a day.    BPH.  On finasteride.  5mg  a day.   PSA higher but still normal, even with accounting for finasteride.    He had his R3rd finger stitched up with good effect.    PMH and SH reviewed  Meds, vitals, and allergies reviewed.   ROS: Per HPI.  Unless specifically indicated otherwise in HPI, the patient denies:  General: fever. Eyes: acute vision changes ENT: sore throat Cardiovascular: chest pain Respiratory: SOB GI: vomiting GU: dysuria Musculoskeletal: acute back pain Derm: acute rash Neuro: acute motor dysfunction Psych: worsening mood Endocrine: polydipsia Heme: bleeding Allergy: hayfever  GEN: nad, alert and oriented HEENT: mucous membranes moist NECK: supple w/o LA CV: rrr. PULM: ctab, no inc wob ABD: soft, +bs EXT: no edema SKIN: no acute rash

## 2017-11-03 NOTE — Patient Instructions (Signed)
Cut out the whites, use the eat right diet and recheck labs prior to a visit in about 3 months.  Take care.  Glad to see you.

## 2017-11-04 NOTE — Assessment & Plan Note (Signed)
No diagnosis.  Labs discussed with patient.  Diabetes pathophysiology discussed with patient.  Needs diet and exercise.  Low carbohydrate diet given to patient.  Offered diabetic education classes.  Deferred for now by patient.  Recheck in 3 months.  With weight loss he would likely not be diabetic, discussed with patient.

## 2017-11-04 NOTE — Assessment & Plan Note (Signed)
Advance directive- wife designated if patient were incapacitated.  

## 2017-11-04 NOTE — Assessment & Plan Note (Signed)
Stream still okay.  No adverse effect of medications.  Labs discussed with patient. PSA higher but still normal, even with accounting for finasteride.  No change in medication at this point.

## 2017-11-04 NOTE — Assessment & Plan Note (Signed)
Continue statin.  Labs discussed with patient.  Needs diet and exercise, discussed.

## 2017-11-04 NOTE — Assessment & Plan Note (Signed)
Flu 2018 Shingles 2009 PNA 2015 Tetanus 2018, d/w pt.  Colonoscopy 2018 Hep C screening - completed prev- neg d/w pt.   AAA screening prev neg PSA 2018, wnl Advance directive- wife designated if patient were incapacitated.  Cognitive function addressed- see scanned forms- and if abnormal then additional documentation follows.

## 2017-11-04 NOTE — Assessment & Plan Note (Signed)
Reasonable control.  No change in meds.  Continue as is.  He agrees. 

## 2018-01-27 DIAGNOSIS — H40051 Ocular hypertension, right eye: Secondary | ICD-10-CM | POA: Diagnosis not present

## 2018-02-18 ENCOUNTER — Other Ambulatory Visit: Payer: Self-pay | Admitting: Family Medicine

## 2018-02-21 ENCOUNTER — Other Ambulatory Visit (INDEPENDENT_AMBULATORY_CARE_PROVIDER_SITE_OTHER): Payer: PPO

## 2018-02-21 DIAGNOSIS — E119 Type 2 diabetes mellitus without complications: Secondary | ICD-10-CM | POA: Diagnosis not present

## 2018-02-21 LAB — HEMOGLOBIN A1C: Hgb A1c MFr Bld: 6.1 % (ref 4.6–6.5)

## 2018-02-24 ENCOUNTER — Encounter: Payer: Self-pay | Admitting: Family Medicine

## 2018-02-24 ENCOUNTER — Ambulatory Visit (INDEPENDENT_AMBULATORY_CARE_PROVIDER_SITE_OTHER): Payer: PPO | Admitting: Family Medicine

## 2018-02-24 DIAGNOSIS — Z8639 Personal history of other endocrine, nutritional and metabolic disease: Secondary | ICD-10-CM

## 2018-02-24 NOTE — Patient Instructions (Addendum)
If you are getting lightheaded, then stop the amlodipine.  Take care.  Glad to see you.  As long as you can keep your weight down then just recheck at a physical.  Thanks for your effort.

## 2018-02-24 NOTE — Progress Notes (Signed)
H/o diabetes:  No meds.   Hypoglycemic episodes: no Hyperglycemic episodes: no Feet problems: no Blood Sugars averaging: not checked.   eye exam within last year: yes A1c is better.  D/w pt.   He is down 15 lbs intentionally.  He made diet changes.   He isn't lightheaded.    PMH and SH reviewed  Meds, vitals, and allergies reviewed.   ROS: Per HPI unless specifically indicated in ROS section   GEN: nad, alert and oriented HEENT: mucous membranes moist NECK: supple w/o LA CV: rrr. PULM: ctab, no inc wob ABD: soft, +bs

## 2018-02-26 NOTE — Assessment & Plan Note (Signed)
No longer diabetic.  Labs discussed with patient. A1c is better.  D/w pt.   He is down 15 lbs intentionally.  He made diet changes.   He isn't lightheaded.   Recheck periodically.  He will update me if his weight is going back up.  If he maintains his weight loss he will likely not be diabetic. See avs.

## 2018-07-27 DIAGNOSIS — H2513 Age-related nuclear cataract, bilateral: Secondary | ICD-10-CM | POA: Diagnosis not present

## 2018-08-04 ENCOUNTER — Ambulatory Visit (INDEPENDENT_AMBULATORY_CARE_PROVIDER_SITE_OTHER): Payer: PPO | Admitting: Family Medicine

## 2018-08-04 ENCOUNTER — Encounter: Payer: Self-pay | Admitting: Family Medicine

## 2018-08-04 VITALS — BP 124/82 | HR 51 | Temp 98.6°F | Ht 70.0 in | Wt 196.5 lb

## 2018-08-04 DIAGNOSIS — H6121 Impacted cerumen, right ear: Secondary | ICD-10-CM | POA: Diagnosis not present

## 2018-08-04 DIAGNOSIS — Z23 Encounter for immunization: Secondary | ICD-10-CM

## 2018-08-04 DIAGNOSIS — H6501 Acute serous otitis media, right ear: Secondary | ICD-10-CM

## 2018-08-04 DIAGNOSIS — H6120 Impacted cerumen, unspecified ear: Secondary | ICD-10-CM | POA: Insufficient documentation

## 2018-08-04 DIAGNOSIS — H6691 Otitis media, unspecified, right ear: Secondary | ICD-10-CM | POA: Insufficient documentation

## 2018-08-04 MED ORDER — FLUTICASONE PROPIONATE 50 MCG/ACT NA SUSP: 2.0000 | Freq: Every day | NASAL | 2 refills | Status: DC

## 2018-08-04 MED ORDER — AMOXICILLIN 500 MG PO CAPS: 500.0000 mg | ORAL_CAPSULE | Freq: Three times a day (TID) | ORAL | 0 refills | Status: DC

## 2018-08-04 NOTE — Patient Instructions (Addendum)
For ear infection take amoxicillin three times daily for a week   To help open the inner ear tube- use flonase daily through the allergy season   Also get Debrox over the counter for ear wax -- use it twice monthly in right ear to help wax build up   Update if not starting to improve in a week or if worsening    If needed-tylenol will help ear pain

## 2018-08-04 NOTE — Addendum Note (Signed)
Addended by: Tammi Sou on: 08/04/2018 10:37 AM   Modules accepted: Orders

## 2018-08-04 NOTE — Assessment & Plan Note (Signed)
Partial in R ear only  Recommend Debrox otc as directed 1-2 times per mo  May need irrigation in the future

## 2018-08-04 NOTE — Progress Notes (Signed)
Subjective:    Patient ID: Raymond Kirby, male    DOB: 05/10/1946, 72 y.o.   MRN: [id number]  HPI Here for R ear complaint  72 yo pt of Dr Damita Dunnings with hx of hay fever Thinks it is an ear infection Started this am   No fever  No malaise  No ST  Last one several years ago   Feels full/like he needs to yawn  Has allergies (cong and runny nose)  Does not take anything for it    Wants flu shot   Patient Active Problem List   Diagnosis Date Noted  . Otitis media, right 08/04/2018  . Cerumen impaction 08/04/2018  . History of diabetes mellitus, type II 10/28/2017  . Health care maintenance 10/20/2016  . Cough 10/19/2016  . GERD (gastroesophageal reflux disease) 10/16/2015  . Rash and nonspecific skin eruption 03/06/2015  . Advance care planning 10/02/2014  . FH: prostate cancer 10/02/2014  . Medicare annual wellness visit, subsequent 05/12/2012  . OTHER SPECIFIED IRON DEFICIENCY ANEMIAS 04/02/2008  . Erectile dysfunction 04/02/2008  . DIVERTICULOSIS, COLON 06/28/2006  . External hemorrhoids 01/14/2001  . HAY FEVER 11/16/1998  . Hyperlipidemia 04/17/1995  . Essential hypertension 11/16/1994  . BPH (benign prostatic hyperplasia) 11/16/1994   Past Medical History:  Diagnosis Date  . BPH (benign prostatic hypertrophy) 11/16/1994  . Diverticulosis   . Hemorrhoids   . Hyperlipemia 04/17/1995  . Hypertension 11/16/1994   Past Surgical History:  Procedure Laterality Date  . COLONOSCOPY     Social History   Tobacco Use  . Smoking status: Never Smoker  . Smokeless tobacco: Never Used  Substance Use Topics  . Alcohol use: Yes    Alcohol/week: 2.0 standard drinks    Types: 2 Shots of liquor per week    Comment: 2 drinks a day  . Drug use: No   Family History  Problem Relation Age of Onset  . Cancer Mother        breast CA Mets, recurrent  . Hypertension Father   . Cancer Father        prostate cancer  . Heart disease Father        MI,CABG  . Aneurysm Father    . Prostate cancer Father   . Stroke Father   . Cancer Sister        breast ca  . Cancer Sister        breast cancer  . Colon cancer Neg Hx    Allergies  Allergen Reactions  . Lisinopril Other (See Comments)    cough   Current Outpatient Medications on File Prior to Visit  Medication Sig Dispense Refill  . atorvastatin (LIPITOR) 80 MG tablet Take 1 tablet (80 mg total) by mouth daily. 90 tablet 3  . bimatoprost (LUMIGAN) 0.03 % ophthalmic solution Place 1 drop into the right eye at bedtime.    . brimonidine (ALPHAGAN P) 0.1 % SOLN Place 1 drop into the right eye daily.    . finasteride (PROSCAR) 5 MG tablet Take 1 tablet (5 mg total) by mouth daily. 90 tablet 3  . hydrocortisone (HEMMOREX-HC) 25 MG suppository INSERT ONE SUPPOSITORY RECTALLY EVERY 12 HOURS as needed 30 suppository 0  . losartan-hydrochlorothiazide (HYZAAR) 100-12.5 MG tablet Take 1 tablet by mouth daily. 90 tablet 3  . omeprazole (PRILOSEC) 20 MG capsule Take 1 capsule (20 mg total) by mouth daily. 90 capsule 3  . psyllium (METAMUCIL) 58.6 % packet Take 1 packet by mouth daily. 2 tablespoons daily  Current Facility-Administered Medications on File Prior to Visit  Medication Dose Route Frequency Provider Last Rate Last Dose  . 0.9 %  sodium chloride infusion  500 mL Intravenous Continuous Armbruster, Carlota Raspberry, MD         Review of Systems  Constitutional: Negative for activity change, appetite change, fatigue, fever and unexpected weight change.  HENT: Positive for ear pain, postnasal drip and rhinorrhea. Negative for congestion, ear discharge, facial swelling, sinus pressure, sinus pain, sore throat and trouble swallowing.   Eyes: Negative for pain, redness, itching and visual disturbance.  Respiratory: Negative for cough, chest tightness, shortness of breath and wheezing.   Cardiovascular: Negative for chest pain and palpitations.  Gastrointestinal: Negative for abdominal pain, blood in stool, constipation,  diarrhea and nausea.  Endocrine: Negative for cold intolerance, heat intolerance, polydipsia and polyuria.  Genitourinary: Negative for difficulty urinating, dysuria, frequency and urgency.  Skin: Negative for pallor and rash.  Neurological: Negative for dizziness, tremors, weakness, numbness and headaches.  Psychiatric/Behavioral: Negative for dysphoric mood. The patient is not nervous/anxious.         Objective:   Physical Exam  Constitutional: He appears well-developed and well-nourished. No distress.  overwt and well app  HENT:  Head: Normocephalic and atraumatic.  Left Ear: External ear normal.  Mouth/Throat: Oropharynx is clear and moist. No oropharyngeal exudate.  Nares are injected and congested    L ear canal and TM are nl   R ear canal -partial cerumen impaction  TM is dull and pink in color  No canal swelling or tenderness Pinna is nl    Eyes: Pupils are equal, round, and reactive to light. Conjunctivae and EOM are normal.  Neck: Normal range of motion. Neck supple.  Cardiovascular: Normal rate, regular rhythm and normal heart sounds.  Pulmonary/Chest: Effort normal and breath sounds normal. No respiratory distress. He has no rales.  Lymphadenopathy:    He has no cervical adenopathy.  Neurological: He is alert. No cranial nerve deficit. Coordination normal.  Skin: Skin is warm. No rash noted. No erythema.  Psychiatric: He has a normal mood and affect.          Assessment & Plan:   Problem List Items Addressed This Visit      Nervous and Auditory   Cerumen impaction    Partial in R ear only  Recommend Debrox otc as directed 1-2 times per mo  May need irrigation in the future       Otitis media, right - Primary    Mild with ETD Has nasal allergies Px flonase to use daily -to help ETD and rhinitis Amoxicillin 500 mg tid for 7 d Tylenol for pain prn  Update if not starting to improve in a week or if worsening    Meds ordered this encounter    Medications  . fluticasone (FLONASE) 50 MCG/ACT nasal spray    Sig: Place 2 sprays into both nostrils daily.    Dispense:  16 g    Refill:  2  . amoxicillin (AMOXIL) 500 MG capsule    Sig: Take 1 capsule (500 mg total) by mouth 3 (three) times daily.    Dispense:  21 capsule    Refill:  0         Relevant Medications   amoxicillin (AMOXIL) 500 MG capsule

## 2018-08-04 NOTE — Assessment & Plan Note (Signed)
Mild with ETD Has nasal allergies Px flonase to use daily -to help ETD and rhinitis Amoxicillin 500 mg tid for 7 d Tylenol for pain prn  Update if not starting to improve in a week or if worsening    Meds ordered this encounter  Medications  . fluticasone (FLONASE) 50 MCG/ACT nasal spray    Sig: Place 2 sprays into both nostrils daily.    Dispense:  16 g    Refill:  2  . amoxicillin (AMOXIL) 500 MG capsule    Sig: Take 1 capsule (500 mg total) by mouth 3 (three) times daily.    Dispense:  21 capsule    Refill:  0

## 2018-08-09 ENCOUNTER — Other Ambulatory Visit: Payer: Self-pay | Admitting: Family Medicine

## 2018-08-30 ENCOUNTER — Other Ambulatory Visit: Payer: Self-pay | Admitting: Family Medicine

## 2018-11-04 ENCOUNTER — Other Ambulatory Visit: Payer: Self-pay | Admitting: Family Medicine

## 2018-11-13 ENCOUNTER — Other Ambulatory Visit: Payer: Self-pay | Admitting: Family Medicine

## 2018-11-13 DIAGNOSIS — D509 Iron deficiency anemia, unspecified: Secondary | ICD-10-CM

## 2018-11-13 DIAGNOSIS — Z8639 Personal history of other endocrine, nutritional and metabolic disease: Secondary | ICD-10-CM

## 2018-11-13 DIAGNOSIS — I10 Essential (primary) hypertension: Secondary | ICD-10-CM

## 2018-11-13 DIAGNOSIS — N400 Enlarged prostate without lower urinary tract symptoms: Secondary | ICD-10-CM

## 2018-11-13 DIAGNOSIS — E785 Hyperlipidemia, unspecified: Secondary | ICD-10-CM

## 2018-11-14 ENCOUNTER — Other Ambulatory Visit (INDEPENDENT_AMBULATORY_CARE_PROVIDER_SITE_OTHER): Payer: PPO

## 2018-11-14 DIAGNOSIS — E785 Hyperlipidemia, unspecified: Secondary | ICD-10-CM

## 2018-11-14 DIAGNOSIS — N400 Enlarged prostate without lower urinary tract symptoms: Secondary | ICD-10-CM

## 2018-11-14 DIAGNOSIS — Z8639 Personal history of other endocrine, nutritional and metabolic disease: Secondary | ICD-10-CM

## 2018-11-14 DIAGNOSIS — D509 Iron deficiency anemia, unspecified: Secondary | ICD-10-CM | POA: Diagnosis not present

## 2018-11-14 LAB — COMPREHENSIVE METABOLIC PANEL
ALT: 19 U/L (ref 0–53)
AST: 17 U/L (ref 0–37)
Albumin: 4.7 g/dL (ref 3.5–5.2)
Alkaline Phosphatase: 54 U/L (ref 39–117)
BUN: 13 mg/dL (ref 6–23)
CO2: 31 meq/L (ref 19–32)
Calcium: 10.6 mg/dL — ABNORMAL HIGH (ref 8.4–10.5)
Chloride: 101 mEq/L (ref 96–112)
Creatinine, Ser: 1.04 mg/dL (ref 0.40–1.50)
GFR: 74.8 mL/min (ref >60.00)
Glucose, Bld: 98 mg/dL (ref 70–99)
Potassium: 4.2 mEq/L (ref 3.5–5.1)
Sodium: 139 mEq/L (ref 135–145)
Total Bilirubin: 0.4 mg/dL (ref 0.2–1.2)
Total Protein: 7 g/dL (ref 6.0–8.3)

## 2018-11-14 LAB — TSH: TSH: 0.77 u[IU]/mL (ref 0.35–4.50)

## 2018-11-14 LAB — CBC WITH DIFFERENTIAL/PLATELET
BASOS PCT: 1.3 % (ref 0.0–3.0)
Basophils Absolute: 0.1 10*3/uL (ref 0.0–0.1)
EOS ABS: 0.3 10*3/uL (ref 0.0–0.7)
Eosinophils Relative: 5.3 % — ABNORMAL HIGH (ref 0.0–5.0)
HCT: 45.7 % (ref 39.0–52.0)
Hemoglobin: 15.2 g/dL (ref 13.0–17.0)
Lymphocytes Relative: 19.6 % (ref 12.0–46.0)
Lymphs Abs: 1 10*3/uL (ref 0.7–4.0)
MCHC: 33.3 g/dL (ref 30.0–36.0)
MCV: 91.2 fl (ref 78.0–100.0)
Monocytes Absolute: 0.5 10*3/uL (ref 0.1–1.0)
Monocytes Relative: 10.2 % (ref 3.0–12.0)
Neutro Abs: 3.2 10*3/uL (ref 1.4–7.7)
Neutrophils Relative %: 63.6 % (ref 43.0–77.0)
Platelets: 207 10*3/uL (ref 150.0–400.0)
RBC: 5.01 Mil/uL (ref 4.22–5.81)
RDW: 15.2 % (ref 11.5–15.5)
WBC: 5 10*3/uL (ref 4.0–10.5)

## 2018-11-14 LAB — LIPID PANEL
CHOL/HDL RATIO: 3
Cholesterol: 151 mg/dL (ref 0–200)
HDL: 52.9 mg/dL (ref >39.00)
LDL Cholesterol: 80 mg/dL (ref 0–99)
NonHDL: 98.57
Triglycerides: 92 mg/dL (ref 0.0–149.0)
VLDL: 18.4 mg/dL (ref 0.0–40.0)

## 2018-11-14 LAB — HEMOGLOBIN A1C: HEMOGLOBIN A1C: 6.1 % (ref 4.6–6.5)

## 2018-11-14 LAB — PSA: PSA: 0.7 ng/mL (ref 0.10–4.00)

## 2018-11-17 ENCOUNTER — Other Ambulatory Visit: Payer: PPO

## 2018-11-22 ENCOUNTER — Encounter: Payer: Self-pay | Admitting: Family Medicine

## 2018-11-22 ENCOUNTER — Ambulatory Visit (INDEPENDENT_AMBULATORY_CARE_PROVIDER_SITE_OTHER): Payer: PPO | Admitting: Family Medicine

## 2018-11-22 VITALS — BP 140/80 | HR 82 | Temp 98.6°F | Ht 70.0 in | Wt 197.2 lb

## 2018-11-22 DIAGNOSIS — D509 Iron deficiency anemia, unspecified: Secondary | ICD-10-CM | POA: Diagnosis not present

## 2018-11-22 DIAGNOSIS — Z7189 Other specified counseling: Secondary | ICD-10-CM

## 2018-11-22 DIAGNOSIS — E785 Hyperlipidemia, unspecified: Secondary | ICD-10-CM

## 2018-11-22 DIAGNOSIS — Z0000 Encounter for general adult medical examination without abnormal findings: Secondary | ICD-10-CM | POA: Diagnosis not present

## 2018-11-22 DIAGNOSIS — R739 Hyperglycemia, unspecified: Secondary | ICD-10-CM | POA: Diagnosis not present

## 2018-11-22 DIAGNOSIS — R05 Cough: Secondary | ICD-10-CM

## 2018-11-22 DIAGNOSIS — I10 Essential (primary) hypertension: Secondary | ICD-10-CM

## 2018-11-22 DIAGNOSIS — R059 Cough, unspecified: Secondary | ICD-10-CM

## 2018-11-22 MED ORDER — LOSARTAN POTASSIUM-HCTZ 100-12.5 MG PO TABS: 1.0000 | ORAL_TABLET | Freq: Every day | ORAL | 3 refills | Status: DC

## 2018-11-22 MED ORDER — ATORVASTATIN CALCIUM 80 MG PO TABS: 80.0000 mg | ORAL_TABLET | Freq: Every day | ORAL | 3 refills | Status: DC

## 2018-11-22 MED ORDER — FINASTERIDE 5 MG PO TABS: 5.0000 mg | ORAL_TABLET | Freq: Every day | ORAL | 3 refills | Status: DC

## 2018-11-22 MED ORDER — HYDROCODONE-HOMATROPINE 5-1.5 MG/5ML PO SYRP: 5.0000 mL | ORAL_SOLUTION | Freq: Three times a day (TID) | ORAL | 0 refills | Status: DC | PRN

## 2018-11-22 NOTE — Progress Notes (Signed)
I have personally reviewed the Medicare Annual Wellness questionnaire and have noted 1. The patient's medical and social history 2. Their use of alcohol, tobacco or illicit drugs 3. Their current medications and supplements 4. The patient's functional ability including ADL's, fall risks, home safety risks and hearing or visual             impairment. 5. Diet and physical activities 6. Evidence for depression or mood disorders  The patients weight, height, BMI have been recorded in the chart and visual acuity is per eye clinic.  I have made referrals, counseling and provided education to the patient based review of the above and I have provided the pt with a written personalized care plan for preventive services.  Provider list updated- see scanned forms.  Routine anticipatory guidance given to patient.  See health maintenance. The possibility exists that previously documented standard health maintenance information may have been brought forward from a previous encounter into this note.  If needed, that same information has been updated to reflect the current situation based on today's encounter.    Flu 2019 Shingles out of stock PNA 2015 Tetanus 2018, d/w pt.  Colonoscopy 2018 Hep C screening - completed prev- neg d/w pt.  AAA screening prev neg PSA 2019, wnl Advance directive- wife designated if patient were incapacitated.  Cognitive function addressed- see scanned forms- and if abnormal then additional documentation follows.   Recent URI with residual cough.  No fevers.  He is feeling better but still coughing.    Hypertension:    Using medication without problems or lightheadedness: yes Chest pain with exertion:no Edema:no Short of breath:no Cough resolved off ACE.   Minimal hypercalcemia noted, similar to previous, likely related to hydrochlorothiazide use.  Discussed with patient.  Not an alarming finding.  Elevated Cholesterol: Using medications without problems:  yes Muscle aches: no Diet compliance: encouraged.   Exercise: encouraged.   H/o anemia.  HGB wnl.  D/w pt.    Hyperglycemia.  Intentional weight loss.  A1c stable.  D/w pt.  Diet and exercise d/w pt.    PMH and SH reviewed  Meds, vitals, and allergies reviewed.   ROS: Per HPI.  Unless specifically indicated otherwise in HPI, the patient denies:  General: fever. Eyes: acute vision changes ENT: sore throat Cardiovascular: chest pain Respiratory: SOB GI: vomiting GU: dysuria Musculoskeletal: acute back pain Derm: acute rash Neuro: acute motor dysfunction Psych: worsening mood Endocrine: polydipsia Heme: bleeding Allergy: hayfever  GEN: nad, alert and oriented HEENT: mucous membranes moist NECK: supple w/o LA CV: rrr. PULM: ctab, no inc wob ABD: soft, +bs EXT: no edema SKIN: no acute rash

## 2018-11-22 NOTE — Patient Instructions (Signed)
Update me as needed.  Sedation caution on cough medicine.  Take care.  Glad to see you.  Thanks for your effort.

## 2018-11-23 NOTE — Assessment & Plan Note (Signed)
Lungs are clear.  Likely residual postinfectious cough.  Sedation caution on medication.  Update me as needed.  He agrees.  Okay for outpatient follow-up.

## 2018-11-23 NOTE — Assessment & Plan Note (Signed)
Lipids are reasonable.  Continue as is.  Continue work on diet and exercise.  He agrees.

## 2018-11-23 NOTE — Assessment & Plan Note (Signed)
H/o anemia.  HGB wnl.  D/w pt.

## 2018-11-23 NOTE — Assessment & Plan Note (Signed)
Advance directive- wife designated if patient were incapacitated.  

## 2018-11-23 NOTE — Assessment & Plan Note (Signed)
Intentional weight loss.  A1c stable.  D/w pt.  Diet and exercise d/w pt.

## 2018-11-23 NOTE — Assessment & Plan Note (Signed)
Flu 2019 Shingles out of stock PNA 2015 Tetanus 2018, d/w pt.  Colonoscopy 2018 Hep C screening - completed prev- neg d/w pt.  AAA screening prev neg PSA 2019, wnl Advance directive- wife designated if patient were incapacitated.  Cognitive function addressed- see scanned forms- and if abnormal then additional documentation follows.

## 2018-11-23 NOTE — Assessment & Plan Note (Signed)
Controlled.  Continue as is.  No change in meds. Minimal hypercalcemia noted, similar to previous, likely related to hydrochlorothiazide use.  Discussed with patient.  Not an alarming finding.

## 2019-01-25 DIAGNOSIS — H40051 Ocular hypertension, right eye: Secondary | ICD-10-CM | POA: Diagnosis not present

## 2019-05-12 DIAGNOSIS — L820 Inflamed seborrheic keratosis: Secondary | ICD-10-CM | POA: Diagnosis not present

## 2019-05-12 DIAGNOSIS — L821 Other seborrheic keratosis: Secondary | ICD-10-CM | POA: Diagnosis not present

## 2019-05-12 DIAGNOSIS — B078 Other viral warts: Secondary | ICD-10-CM | POA: Diagnosis not present

## 2019-08-02 DIAGNOSIS — H40051 Ocular hypertension, right eye: Secondary | ICD-10-CM | POA: Diagnosis not present

## 2019-08-23 ENCOUNTER — Other Ambulatory Visit: Payer: Self-pay | Admitting: Family Medicine

## 2019-11-03 ENCOUNTER — Other Ambulatory Visit: Payer: Self-pay | Admitting: Family Medicine

## 2019-11-22 ENCOUNTER — Other Ambulatory Visit: Payer: Self-pay | Admitting: Family Medicine

## 2019-11-23 ENCOUNTER — Other Ambulatory Visit: Payer: Self-pay | Admitting: Family Medicine

## 2019-11-28 ENCOUNTER — Other Ambulatory Visit (INDEPENDENT_AMBULATORY_CARE_PROVIDER_SITE_OTHER): Payer: PPO

## 2019-11-28 ENCOUNTER — Other Ambulatory Visit: Payer: Self-pay | Admitting: Family Medicine

## 2019-11-28 ENCOUNTER — Ambulatory Visit (INDEPENDENT_AMBULATORY_CARE_PROVIDER_SITE_OTHER): Payer: PPO

## 2019-11-28 ENCOUNTER — Other Ambulatory Visit: Payer: Self-pay

## 2019-11-28 DIAGNOSIS — I10 Essential (primary) hypertension: Secondary | ICD-10-CM

## 2019-11-28 DIAGNOSIS — Z0000 Encounter for general adult medical examination without abnormal findings: Secondary | ICD-10-CM

## 2019-11-28 DIAGNOSIS — Z125 Encounter for screening for malignant neoplasm of prostate: Secondary | ICD-10-CM

## 2019-11-28 DIAGNOSIS — R739 Hyperglycemia, unspecified: Secondary | ICD-10-CM | POA: Diagnosis not present

## 2019-11-28 LAB — LIPID PANEL
Cholesterol: 186 mg/dL (ref 0–200)
HDL: 62.1 mg/dL (ref >39.00)
LDL Cholesterol: 110 mg/dL — ABNORMAL HIGH (ref 0–99)
NonHDL: 124.03
Total CHOL/HDL Ratio: 3
Triglycerides: 68 mg/dL (ref 0.0–149.0)
VLDL: 13.6 mg/dL (ref 0.0–40.0)

## 2019-11-28 LAB — COMPREHENSIVE METABOLIC PANEL
ALT: 27 U/L (ref 0–53)
AST: 19 U/L (ref 0–37)
Albumin: 4.7 g/dL (ref 3.5–5.2)
Alkaline Phosphatase: 46 U/L (ref 39–117)
BUN: 22 mg/dL (ref 6–23)
CO2: 30 mEq/L (ref 19–32)
Calcium: 10.5 mg/dL (ref 8.4–10.5)
Chloride: 101 mEq/L (ref 96–112)
Creatinine, Ser: 1.02 mg/dL (ref 0.40–1.50)
GFR: 71.76 mL/min (ref >60.00)
Glucose, Bld: 108 mg/dL — ABNORMAL HIGH (ref 70–99)
Potassium: 4 mEq/L (ref 3.5–5.1)
Sodium: 139 mEq/L (ref 135–145)
Total Bilirubin: 0.5 mg/dL (ref 0.2–1.2)
Total Protein: 7.3 g/dL (ref 6.0–8.3)

## 2019-11-28 LAB — CBC WITH DIFFERENTIAL/PLATELET
Basophils Absolute: 0.1 10*3/uL (ref 0.0–0.1)
Basophils Relative: 1.1 % (ref 0.0–3.0)
Eosinophils Absolute: 0.3 10*3/uL (ref 0.0–0.7)
Eosinophils Relative: 4.6 % (ref 0.0–5.0)
HCT: 48.4 % (ref 39.0–52.0)
Hemoglobin: 16.1 g/dL (ref 13.0–17.0)
Lymphocytes Relative: 18 % (ref 12.0–46.0)
Lymphs Abs: 1 10*3/uL (ref 0.7–4.0)
MCHC: 33.2 g/dL (ref 30.0–36.0)
MCV: 98.8 fl (ref 78.0–100.0)
Monocytes Absolute: 0.6 10*3/uL (ref 0.1–1.0)
Monocytes Relative: 10.6 % (ref 3.0–12.0)
Neutro Abs: 3.8 10*3/uL (ref 1.4–7.7)
Neutrophils Relative %: 65.7 % (ref 43.0–77.0)
Platelets: 214 10*3/uL (ref 150.0–400.0)
RBC: 4.9 Mil/uL (ref 4.22–5.81)
RDW: 13.2 % (ref 11.5–15.5)
WBC: 5.8 10*3/uL (ref 4.0–10.5)

## 2019-11-28 LAB — PSA, MEDICARE: PSA: 0.99 ng/ml (ref 0.10–4.00)

## 2019-11-28 LAB — TSH: TSH: 1.19 u[IU]/mL (ref 0.35–4.50)

## 2019-11-28 LAB — HEMOGLOBIN A1C: Hgb A1c MFr Bld: 5.9 % (ref 4.6–6.5)

## 2019-11-28 NOTE — Progress Notes (Signed)
PCP notes:  Health Maintenance: No gaps noted   Abnormal Screenings: none   Patient concerns: none   Nurse concerns: none   Next PCP appt.: 12/05/2019 @ 2:15 pm

## 2019-11-28 NOTE — Patient Instructions (Signed)
Raymond Kirby , Thank you for taking time to come for your Medicare Wellness Visit. I appreciate your ongoing commitment to your health goals. Please review the following plan we discussed and let me know if I can assist you in the future.   Screening recommendations/referrals: Colonoscopy: Up to date, completed 12/14/2016 Recommended yearly ophthalmology/optometry visit for glaucoma screening and checkup Recommended yearly dental visit for hygiene and checkup  Vaccinations: Influenza vaccine: Up to date, completed 09/09/2019 Pneumococcal vaccine: Completed series Tdap vaccine: Up to date, completed 09/15/2017 Shingles vaccine: discussed    Advanced directives: Please bring a copy of your POA (Power of [place]) and/or Living Will to your next appointment.   Conditions/risks identified: hypertension, hyperlipidemia  Next appointment: 12/05/2019 @ 2:15 pm   Preventive Care 65 Years and Older, Male Preventive care refers to lifestyle choices and visits with your health care provider that can promote health and wellness. What does preventive care include?  A yearly physical exam. This is also called an annual well check.  Dental exams once or twice a year.  Routine eye exams. Ask your health care provider how often you should have your eyes checked.  Personal lifestyle choices, including:  Daily care of your teeth and gums.  Regular physical activity.  Eating a healthy diet.  Avoiding tobacco and drug use.  Limiting alcohol use.  Practicing safe sex.  Taking low doses of aspirin every day.  Taking vitamin and mineral supplements as recommended by your health care provider. What happens during an annual well check? The services and screenings done by your health care provider during your annual well check will depend on your age, overall health, lifestyle risk factors, and family history of disease. Counseling  Your health care provider may ask you questions about  your:  Alcohol use.  Tobacco use.  Drug use.  Emotional well-being.  Home and relationship well-being.  Sexual activity.  Eating habits.  History of falls.  Memory and ability to understand (cognition).  Work and work Statistician. Screening  You may have the following tests or measurements:  Height, weight, and BMI.  Blood pressure.  Lipid and cholesterol levels. These may be checked every 5 years, or more frequently if you are over 37 years old.  Skin check.  Lung cancer screening. You may have this screening every year starting at age 12 if you have a 30-pack-year history of smoking and currently smoke or have quit within the past 15 years.  Fecal occult blood test (FOBT) of the stool. You may have this test every year starting at age 72.  Flexible sigmoidoscopy or colonoscopy. You may have a sigmoidoscopy every 5 years or a colonoscopy every 10 years starting at age 70.  Prostate cancer screening. Recommendations will vary depending on your family history and other risks.  Hepatitis C blood test.  Hepatitis B blood test.  Sexually transmitted disease (STD) testing.  Diabetes screening. This is done by checking your blood sugar (glucose) after you have not eaten for a while (fasting). You may have this done every 1-3 years.  Abdominal aortic aneurysm (AAA) screening. You may need this if you are a current or former smoker.  Osteoporosis. You may be screened starting at age 44 if you are at high risk. Talk with your health care provider about your test results, treatment options, and if necessary, the need for more tests. Vaccines  Your health care provider may recommend certain vaccines, such as:  Influenza vaccine. This is recommended every year.  Tetanus, diphtheria, and acellular pertussis (Tdap, Td) vaccine. You may need a Td booster every 10 years.  Zoster vaccine. You may need this after age 3.  Pneumococcal 13-valent conjugate (PCV13) vaccine.  One dose is recommended after age 82.  Pneumococcal polysaccharide (PPSV23) vaccine. One dose is recommended after age 60. Talk to your health care provider about which screenings and vaccines you need and how often you need them. This information is not intended to replace advice given to you by your health care provider. Make sure you discuss any questions you have with your health care provider. Document Released: 11/29/2015 Document Revised: 07/22/2016 Document Reviewed: 09/03/2015 Elsevier Interactive Patient Education  2017 [place] Prevention in the Home Falls can cause injuries. They can happen to people of all ages. There are many things you can do to make your home safe and to help prevent falls. What can I do on the outside of my home?  Regularly fix the edges of walkways and driveways and fix any cracks.  Remove anything that might make you trip as you walk through a door, such as a raised step or threshold.  Trim any bushes or trees on the path to your home.  Use bright outdoor lighting.  Clear any walking paths of anything that might make someone trip, such as rocks or tools.  Regularly check to see if handrails are loose or broken. Make sure that both sides of any steps have handrails.  Any raised decks and porches should have guardrails on the edges.  Have any leaves, snow, or ice cleared regularly.  Use sand or salt on walking paths during winter.  Clean up any spills in your garage right away. This includes oil or grease spills. What can I do in the bathroom?  Use night lights.  Install grab bars by the toilet and in the tub and shower. Do not use towel bars as grab bars.  Use non-skid mats or decals in the tub or shower.  If you need to sit down in the shower, use a plastic, non-slip stool.  Keep the floor dry. Clean up any water that spills on the floor as soon as it happens.  Remove soap buildup in the tub or shower regularly.  Attach bath  mats securely with double-sided non-slip rug tape.  Do not have throw rugs and other things on the floor that can make you trip. What can I do in the bedroom?  Use night lights.  Make sure that you have a light by your bed that is easy to reach.  Do not use any sheets or blankets that are too big for your bed. They should not hang down onto the floor.  Have a firm chair that has side arms. You can use this for support while you get dressed.  Do not have throw rugs and other things on the floor that can make you trip. What can I do in the kitchen?  Clean up any spills right away.  Avoid walking on wet floors.  Keep items that you use a lot in easy-to-reach places.  If you need to reach something above you, use a strong step stool that has a grab bar.  Keep electrical cords out of the way.  Do not use floor polish or wax that makes floors slippery. If you must use wax, use non-skid floor wax.  Do not have throw rugs and other things on the floor that can make you trip. What can I do  with my stairs?  Do not leave any items on the stairs.  Make sure that there are handrails on both sides of the stairs and use them. Fix handrails that are broken or loose. Make sure that handrails are as long as the stairways.  Check any carpeting to make sure that it is firmly attached to the stairs. Fix any carpet that is loose or worn.  Avoid having throw rugs at the top or bottom of the stairs. If you do have throw rugs, attach them to the floor with carpet tape.  Make sure that you have a light switch at the top of the stairs and the bottom of the stairs. If you do not have them, ask someone to add them for you. What else can I do to help prevent falls?  Wear shoes that:  Do not have high heels.  Have rubber bottoms.  Are comfortable and fit you well.  Are closed at the toe. Do not wear sandals.  If you use a stepladder:  Make sure that it is fully opened. Do not climb a closed  stepladder.  Make sure that both sides of the stepladder are locked into place.  Ask someone to hold it for you, if possible.  Clearly mark and make sure that you can see:  Any grab bars or handrails.  First and last steps.  Where the edge of each step is.  Use tools that help you move around (mobility aids) if they are needed. These include:  Canes.  Walkers.  Scooters.  Crutches.  Turn on the lights when you go into a dark area. Replace any light bulbs as soon as they burn out.  Set up your furniture so you have a clear path. Avoid moving your furniture around.  If any of your floors are uneven, fix them.  If there are any pets around you, be aware of where they are.  Review your medicines with your doctor. Some medicines can make you feel dizzy. This can increase your chance of falling. Ask your doctor what other things that you can do to help prevent falls. This information is not intended to replace advice given to you by your health care provider. Make sure you discuss any questions you have with your health care provider. Document Released: 08/29/2009 Document Revised: 04/09/2016 Document Reviewed: 12/07/2014 Elsevier Interactive Patient Education  2017 [employer].

## 2019-11-28 NOTE — Progress Notes (Signed)
Subjective:   Raymond Kirby is a 74 y.o. male who presents for Medicare Annual/Subsequent preventive examination.  Review of Systems: N/A   This visit is being conducted through telemedicine via telephone at the nurse health advisor's home address due to the COVID-19 pandemic. This patient has given me verbal consent via doximity to conduct this visit, patient states they are participating from their home address. Patient and myself are on the telephone call. There is no referral for this visit. Some vital signs may be absent or patient reported.    Patient identification: identified by name, DOB, and current address   Cardiac Risk Factors include: advanced age (>84men, >45 women);male gender;hypertension;dyslipidemia     Objective:    Vitals: There were no vitals taken for this visit.  There is no height or weight on file to calculate BMI.  Advanced Directives 11/28/2019 11/30/2016 10/14/2016  Does Patient Have a Medical Advance Directive? Yes No Yes  Type of Paramedic of [place];Living will - [place];Living will  Copy of [place] in Chart? No - copy requested - No - copy requested    Tobacco Social History   Tobacco Use  Smoking Status Never Smoker  Smokeless Tobacco Never Used     Counseling given: Not Answered   Clinical Intake:  Pre-visit preparation completed: Yes  Pain : No/denies pain     Nutritional Risks: None Diabetes: No  How often do you need to have someone help you when you read instructions, pamphlets, or other written materials from your doctor or pharmacy?: 1 - Never What is the last grade level you completed in school?: college  Interpreter Needed?: No  Information entered by :: CJohnson, LPN  Past Medical History:  Diagnosis Date  . BPH (benign prostatic hypertrophy) 11/16/1994  . Diverticulosis   . Hemorrhoids   . Hyperlipemia 04/17/1995  . Hypertension 11/16/1994   Past  Surgical History:  Procedure Laterality Date  . COLONOSCOPY     Family History  Problem Relation Age of Onset  . Cancer Mother        breast CA Mets, recurrent  . Hypertension Father   . Cancer Father        prostate cancer  . Heart disease Father        MI,CABG  . Aneurysm Father   . Prostate cancer Father   . Stroke Father   . Cancer Sister        breast ca  . Cancer Sister        breast cancer  . Colon cancer Neg Hx    Social History   Socioeconomic History  . Marital status: Married    Spouse name: Not on file  . Number of children: 2  . Years of education: Not on file  . Highest education level: Not on file  Occupational History  . Occupation: Photographer  Tobacco Use  . Smoking status: Never Smoker  . Smokeless tobacco: Never Used  Substance and Sexual Activity  . Alcohol use: Yes    Alcohol/week: 2.0 standard drinks    Types: 2 Shots of liquor per week    Comment: 2 drinks a day  . Drug use: No  . Sexual activity: Yes  Other Topics Concern  . Not on file  Social History Narrative   Married 40+ years   Photographer    2 kids, 5 grandkids.     [employer] grad   Social Determinants of Health  Financial Resource Strain: Low Risk   . Difficulty of Paying Living Expenses: Not hard at all  Food Insecurity: No Food Insecurity  . Worried About Charity fundraiser in the Last Year: Never true  . Ran Out of Food in the Last Year: Never true  Transportation Needs: No Transportation Needs  . Lack of Transportation (Medical): No  . Lack of Transportation (Non-Medical): No  Physical Activity: Inactive  . Days of Exercise per Week: 0 days  . Minutes of Exercise per Session: 0 min  Stress: No Stress Concern Present  . Feeling of Stress : Not at all  Social Connections:   . Frequency of Communication with Friends and Family: Not on file  . Frequency of Social Gatherings with Friends and Family: Not on file  . Attends Religious Services: Not on file  . Active  Member of Clubs or Organizations: Not on file  . Attends Archivist Meetings: Not on file  . Marital Status: Not on file    Outpatient Encounter Medications as of 11/28/2019  Medication Sig  . atorvastatin (LIPITOR) 80 MG tablet Take 1 tablet (80 mg total) by mouth daily.  . bimatoprost (LUMIGAN) 0.03 % ophthalmic solution Place 1 drop into the right eye at bedtime.  . brimonidine (ALPHAGAN P) 0.1 % SOLN Place 1 drop into the right eye daily.  . finasteride (PROSCAR) 5 MG tablet TAKE ONE TABLET EVERY DAY  . fluticasone (FLONASE) 50 MCG/ACT nasal spray Place 2 sprays into both nostrils daily.  . hydrochlorothiazide (HYDRODIURIL) 12.5 MG tablet TAKE 1 TABLET BY MOUTH DAILY  . HYDROcodone-homatropine (HYCODAN) 5-1.5 MG/5ML syrup Take 5 mLs by mouth every 8 (eight) hours as needed for cough.  . hydrocortisone (HEMMOREX-HC) 25 MG suppository INSERT ONE SUPPOSITORY RECTALLY EVERY 12 HOURS as needed  . losartan (COZAAR) 100 MG tablet TAKE 1 TABLET BY MOUTH DAILY  . losartan-hydrochlorothiazide (HYZAAR) 100-12.5 MG tablet TAKE ONE TABLET EVERY DAY  . omeprazole (PRILOSEC) 20 MG capsule TAKE 1 CAPSULE BY MOUTH ONCE DAILY  . psyllium (METAMUCIL) 58.6 % packet Take 1 packet by mouth daily. 2 tablespoons daily   Facility-Administered Encounter Medications as of 11/28/2019  Medication  . 0.9 %  sodium chloride infusion    Activities of Daily Living In your present state of health, do you have any difficulty performing the following activities: 11/28/2019  Hearing? N  Vision? N  Difficulty concentrating or making decisions? N  Walking or climbing stairs? N  Dressing or bathing? N  Doing errands, shopping? N  Preparing Food and eating ? N  Using the Toilet? N  In the past six months, have you accidently leaked urine? N  Do you have problems with loss of bowel control? N  Managing your Medications? N  Managing your Finances? N  Housekeeping or managing your Housekeeping? N  Some recent  data might be hidden    Patient Care Team: Tonia Ghent, MD as PCP - General (Family Medicine)   Assessment:   This is a routine wellness examination for Raymond Kirby.  Exercise Activities and Dietary recommendations Current Exercise Habits: The patient does not participate in regular exercise at present, Exercise limited by: None identified  Goals    . Increase water intake     Starting 10/14/2016, I will attempt to drink at least 8 oz water with each meal daily.     . Patient Stated     11/28/2019, I will maintain and continue medications as prescribed.  Fall Risk Fall Risk  11/28/2019 11/22/2018 11/03/2017 10/14/2016 10/14/2015  Falls in the past year? 0 0 No No No  Number falls in past yr: 0 - - - -  Injury with Fall? 0 - - - -  Risk for fall due to : Medication side effect - - - -  Follow up Falls evaluation completed;Falls prevention discussed - - - -   Is the patient's home free of loose throw rugs in walkways, pet beds, electrical cords, etc?   yes      Grab bars in the bathroom? no      Handrails on the stairs?   yes      Adequate lighting?   yes  Timed Get Up and Go Performed: N/A  Depression Screen PHQ 2/9 Scores 11/28/2019 11/22/2018 11/03/2017 10/14/2016  PHQ - 2 Score 0 0 0 0  PHQ- 9 Score 0 - - -    Cognitive Function MMSE - Mini Mental State Exam 11/28/2019 10/14/2016  Orientation to time 5 5  Orientation to Place 5 5  Registration 3 3  Attention/ Calculation 5 0  Recall 3 3  Language- name 2 objects - 0  Language- repeat 1 1  Language- follow 3 step command - 3  Language- read & follow direction - 0  Write a sentence - 0  Copy design - 0  Total score - 20  Mini Cog  Mini-Cog screen was completed. Maximum score is 22. A value of 0 denotes this part of the MMSE was not completed or the patient failed this part of the Mini-Cog screening.       Immunization History  Administered Date(s) Administered  . Influenza Whole 10/23/2009  .  Influenza,inj,Quad PF,6+ Mos 08/18/2013, 10/01/2014, 10/14/2015, 10/14/2016, 08/04/2018  . Influenza-Unspecified 08/30/2017, 09/09/2019  . Pneumococcal Conjugate-13 10/01/2014  . Pneumococcal Polysaccharide-23 08/18/2013  . Td 02/15/1995, 11/11/2005  . Tdap 09/15/2017  . Zoster 04/02/2008    Qualifies for Shingles Vaccine? Yes  Screening Tests Health Maintenance  Topic Date Due  . COLONOSCOPY  12/15/2019  . TETANUS/TDAP  09/16/2027  . INFLUENZA VACCINE  Completed  . Hepatitis C Screening  Completed  . PNA vac Low Risk Adult  Completed   Cancer Screenings: Lung: Low Dose CT Chest recommended if Age 79-80 years, 30 pack-year currently smoking OR have quit w/in 15years. Patient does not qualify. Colorectal: Up to date, completed 12/14/2016   Additional Screenings:  Hepatitis C Screening: 10/14/2016      Plan:    Patient will maintain and continue medications as prescribed.   I have personally reviewed and noted the following in the patient's chart:   . Medical and social history . Use of alcohol, tobacco or illicit drugs  . Current medications and supplements . Functional ability and status . Nutritional status . Physical activity . Advanced directives . List of other physicians . Hospitalizations, surgeries, and ER visits in previous 12 months . Vitals . Screenings to include cognitive, depression, and falls . Referrals and appointments  In addition, I have reviewed and discussed with patient certain preventive protocols, quality metrics, and best practice recommendations. A written personalized care plan for preventive services as well as general preventive health recommendations were provided to patient.     Andrez Grime, LPN  [id number]

## 2019-11-29 ENCOUNTER — Other Ambulatory Visit: Payer: Self-pay | Admitting: Family Medicine

## 2019-12-05 ENCOUNTER — Encounter: Payer: Self-pay | Admitting: Family Medicine

## 2019-12-05 ENCOUNTER — Ambulatory Visit (INDEPENDENT_AMBULATORY_CARE_PROVIDER_SITE_OTHER): Payer: PPO | Admitting: Family Medicine

## 2019-12-05 ENCOUNTER — Other Ambulatory Visit: Payer: Self-pay

## 2019-12-05 VITALS — BP 126/62 | HR 63 | Temp 96.2°F | Ht 70.0 in | Wt 201.0 lb

## 2019-12-05 DIAGNOSIS — R739 Hyperglycemia, unspecified: Secondary | ICD-10-CM | POA: Diagnosis not present

## 2019-12-05 DIAGNOSIS — E785 Hyperlipidemia, unspecified: Secondary | ICD-10-CM

## 2019-12-05 DIAGNOSIS — I10 Essential (primary) hypertension: Secondary | ICD-10-CM

## 2019-12-05 DIAGNOSIS — J310 Chronic rhinitis: Secondary | ICD-10-CM

## 2019-12-05 DIAGNOSIS — N400 Enlarged prostate without lower urinary tract symptoms: Secondary | ICD-10-CM

## 2019-12-05 DIAGNOSIS — Z7189 Other specified counseling: Secondary | ICD-10-CM

## 2019-12-05 DIAGNOSIS — Z0000 Encounter for general adult medical examination without abnormal findings: Secondary | ICD-10-CM

## 2019-12-05 MED ORDER — ATORVASTATIN CALCIUM 80 MG PO TABS: 80.0000 mg | ORAL_TABLET | Freq: Every day | ORAL | 3 refills | Status: DC

## 2019-12-05 MED ORDER — HYDROCHLOROTHIAZIDE 12.5 MG PO TABS: 12.5000 mg | ORAL_TABLET | Freq: Every day | ORAL | 3 refills | Status: DC

## 2019-12-05 MED ORDER — OMEPRAZOLE 20 MG PO CPDR: 20.0000 mg | DELAYED_RELEASE_CAPSULE | Freq: Every day | ORAL | 3 refills | Status: DC

## 2019-12-05 MED ORDER — TADALAFIL 5 MG PO TABS: 2.5000 mg | ORAL_TABLET | Freq: Every day | ORAL | Status: DC

## 2019-12-05 MED ORDER — FINASTERIDE 5 MG PO TABS: 5.0000 mg | ORAL_TABLET | Freq: Every day | ORAL | 3 refills | Status: DC

## 2019-12-05 MED ORDER — FLUTICASONE PROPIONATE 50 MCG/ACT NA SUSP: 2.0000 | Freq: Every day | NASAL | 3 refills | Status: DC

## 2019-12-05 MED ORDER — LOSARTAN POTASSIUM 100 MG PO TABS: 100.0000 mg | ORAL_TABLET | Freq: Every day | ORAL | 3 refills | Status: DC

## 2019-12-05 NOTE — Patient Instructions (Addendum)
[email or web address]  Check with your insurance to see if they will cover the shingles shot. Let me know when you want to go for the colonoscopy.   Take care.  Glad to see you. Update me as needed.

## 2019-12-05 NOTE — Progress Notes (Signed)
This visit occurred during the SARS-CoV-2 public health emergency.  Safety protocols were in place, including screening questions prior to the visit, additional usage of staff PPE, and extensive cleaning of exam room while observing appropriate contact time as indicated for disinfecting solutions.  We talked about covid vaccine.  See avs.  Flu vaccine - administered prev.   Vaccines d/w pt.  Colonoscopy deferred at this point given pandemic.   PSA still normal 2021.  UOP is fair.   He is taking cialis at baseline.  D/w pt.   Hep C screening - completed- neg d/w pt.   AAA screening prev neg.   Advance directive- wife designated if patient were incapacitated.   Elevated Cholesterol: Using medications without problems: yes Muscle aches: no Diet compliance: encouraged, d/w pt.   Exercise: walking most days at baseline up to 2 miles.    Rhinitis.  Improved with Flonase.  Continue as is.  Hypertension:    Using medication without problems or lightheadedness: yes Chest pain with exertion:no Edema:no Short of breath:no  Mild hyperglycemia discussed with patient.  Diet and exercise discussed.  Not diabetic by A1c.  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   PMH and SH reviewed  Meds, vitals, and allergies reviewed.   ROS: Per HPI unless specifically indicated in ROS section   GEN: nad, alert and oriented HEENT: ncat NECK: supple w/o LA CV: rrr. PULM: ctab, no inc wob ABD: soft, +bs EXT: no edema SKIN: no acute rash

## 2019-12-07 NOTE — Assessment & Plan Note (Signed)
Advance directive- wife designated if patient were incapacitated.  

## 2019-12-07 NOTE — Assessment & Plan Note (Signed)
PSA 0.99, while on finasteride.  True value would be approximately 2.  Discussed doubling his PSA while on finasteride to get his "true" value.  His current level is still fine, he understood.

## 2019-12-07 NOTE — Assessment & Plan Note (Signed)
Reasonable control.  No change in meds.  Continue work on diet and exercise.  Labs discussed with patient.  He agrees.

## 2019-12-07 NOTE — Assessment & Plan Note (Signed)
Mild hyperglycemia discussed with patient.  Diet and exercise discussed.  Not diabetic by A1c.

## 2019-12-07 NOTE — Assessment & Plan Note (Signed)
Improved with Flonase.  Continue as is.

## 2019-12-07 NOTE — Assessment & Plan Note (Signed)
We talked about covid vaccine.  See avs.  Flu vaccine - administered prev.   Vaccines d/w pt.  Colonoscopy deferred at this point given pandemic.   PSA still normal 2021.  UOP is fair.   He is taking cialis at baseline.  D/w pt.   Hep C screening - completed- neg d/w pt.   AAA screening prev neg.   Advance directive- wife designated if patient were incapacitated.

## 2020-02-01 DIAGNOSIS — H40051 Ocular hypertension, right eye: Secondary | ICD-10-CM | POA: Diagnosis not present

## 2020-02-01 DIAGNOSIS — H2513 Age-related nuclear cataract, bilateral: Secondary | ICD-10-CM | POA: Diagnosis not present

## 2020-03-04 DIAGNOSIS — I10 Essential (primary) hypertension: Secondary | ICD-10-CM | POA: Diagnosis not present

## 2020-03-04 DIAGNOSIS — H2511 Age-related nuclear cataract, right eye: Secondary | ICD-10-CM | POA: Diagnosis not present

## 2020-03-12 ENCOUNTER — Encounter: Payer: Self-pay | Admitting: Ophthalmology

## 2020-03-12 ENCOUNTER — Other Ambulatory Visit: Payer: Self-pay

## 2020-03-15 ENCOUNTER — Other Ambulatory Visit
Admission: RE | Admit: 2020-03-15 | Discharge: 2020-03-15 | Disposition: A | Discharge status: Home / Self Care | Payer: PPO | Source: Ambulatory Visit | Attending: Ophthalmology | Admitting: Ophthalmology

## 2020-03-15 DIAGNOSIS — Z01812 Encounter for preprocedural laboratory examination: Secondary | ICD-10-CM | POA: Insufficient documentation

## 2020-03-15 DIAGNOSIS — Z20822 Contact with and (suspected) exposure to covid-19: Secondary | ICD-10-CM | POA: Diagnosis not present

## 2020-03-15 NOTE — Discharge Instructions (Signed)

## 2020-03-16 LAB — SARS CORONAVIRUS 2 (TAT 6-24 HRS): SARS Coronavirus 2: NEGATIVE

## 2020-03-19 ENCOUNTER — Ambulatory Visit: Payer: PPO | Admitting: Anesthesiology

## 2020-03-19 ENCOUNTER — Ambulatory Visit
Admission: RE | Admit: 2020-03-19 | Discharge: 2020-03-19 | Disposition: A | Discharge status: Home / Self Care | Payer: PPO | Attending: Ophthalmology | Admitting: Ophthalmology

## 2020-03-19 ENCOUNTER — Encounter: Payer: Self-pay | Admitting: Ophthalmology

## 2020-03-19 ENCOUNTER — Encounter
Admission: RE | Disposition: A | Discharge status: Home / Self Care | Payer: Self-pay | Source: Home / Self Care | Attending: Ophthalmology

## 2020-03-19 DIAGNOSIS — N400 Enlarged prostate without lower urinary tract symptoms: Secondary | ICD-10-CM | POA: Diagnosis not present

## 2020-03-19 DIAGNOSIS — Z6828 Body mass index (BMI) 28.0-28.9, adult: Secondary | ICD-10-CM | POA: Insufficient documentation

## 2020-03-19 DIAGNOSIS — H25811 Combined forms of age-related cataract, right eye: Secondary | ICD-10-CM | POA: Diagnosis not present

## 2020-03-19 DIAGNOSIS — H2511 Age-related nuclear cataract, right eye: Secondary | ICD-10-CM | POA: Diagnosis not present

## 2020-03-19 DIAGNOSIS — Z79899 Other long term (current) drug therapy: Secondary | ICD-10-CM | POA: Insufficient documentation

## 2020-03-19 DIAGNOSIS — K219 Gastro-esophageal reflux disease without esophagitis: Secondary | ICD-10-CM | POA: Insufficient documentation

## 2020-03-19 DIAGNOSIS — I10 Essential (primary) hypertension: Secondary | ICD-10-CM | POA: Diagnosis not present

## 2020-03-19 DIAGNOSIS — E7800 Pure hypercholesterolemia, unspecified: Secondary | ICD-10-CM | POA: Diagnosis not present

## 2020-03-19 HISTORY — PX: CATARACT EXTRACTION W/PHACO: SHX586

## 2020-03-19 HISTORY — DX: Gastro-esophageal reflux disease without esophagitis: K21.9

## 2020-03-19 SURGERY — PHACOEMULSIFICATION, CATARACT, WITH IOL INSERTION
Anesthesia: Monitor Anesthesia Care | Site: Eye | Laterality: Right

## 2020-03-19 MED ORDER — TETRACAINE HCL 0.5 % OP SOLN
1.0000 [drp] | OPHTHALMIC | Status: DC | PRN
  Administered 2020-03-19 (×3): 1 [drp] via OPHTHALMIC

## 2020-03-19 MED ORDER — ARMC OPHTHALMIC DILATING DROPS
1.0000 "application " | OPHTHALMIC | Status: DC | PRN
  Administered 2020-03-19 (×3): 1 via OPHTHALMIC

## 2020-03-19 MED ORDER — OXYCODONE HCL 5 MG PO TABS: 5.0000 mg | ORAL_TABLET | Freq: Once | ORAL | Status: DC | PRN

## 2020-03-19 MED ORDER — MOXIFLOXACIN HCL 0.5 % OP SOLN
OPHTHALMIC | Status: DC | PRN
  Administered 2020-03-19: 0.2 mL via OPHTHALMIC

## 2020-03-19 MED ORDER — OXYCODONE HCL 5 MG/5ML PO SOLN: 5.0000 mg | Freq: Once | ORAL | Status: DC | PRN

## 2020-03-19 MED ORDER — MIDAZOLAM HCL 2 MG/2ML IJ SOLN
INTRAMUSCULAR | Status: DC | PRN
  Administered 2020-03-19 (×2): 1 mg via INTRAVENOUS

## 2020-03-19 MED ORDER — BRIMONIDINE TARTRATE-TIMOLOL 0.2-0.5 % OP SOLN
OPHTHALMIC | Status: DC | PRN
  Administered 2020-03-19: 1 [drp] via OPHTHALMIC

## 2020-03-19 MED ORDER — EPINEPHRINE PF 1 MG/ML IJ SOLN
INTRAOCULAR | Status: DC | PRN
  Administered 2020-03-19: 78 mL via OPHTHALMIC

## 2020-03-19 MED ORDER — LIDOCAINE HCL (PF) 2 % IJ SOLN
INTRAOCULAR | Status: DC | PRN
  Administered 2020-03-19: 1 mL

## 2020-03-19 MED ORDER — FENTANYL CITRATE (PF) 100 MCG/2ML IJ SOLN
INTRAMUSCULAR | Status: DC | PRN
  Administered 2020-03-19 (×2): 50 ug via INTRAVENOUS

## 2020-03-19 MED ORDER — NA CHONDROIT SULF-NA HYALURON 40-17 MG/ML IO SOLN
INTRAOCULAR | Status: DC | PRN
  Administered 2020-03-19 (×2): 1 mL via INTRAOCULAR

## 2020-03-19 SURGICAL SUPPLY — 22 items
CANNULA ANT/CHMB 27G (MISCELLANEOUS) ×2 IMPLANT
CANNULA ANT/CHMB 27GA (MISCELLANEOUS) ×6 IMPLANT
DISSECTOR HYDRO NUCLEUS 50X22 (MISCELLANEOUS) ×3 IMPLANT
GLOVE SURG LX 8.0 MICRO (GLOVE) ×2
GLOVE SURG LX STRL 8.0 MICRO (GLOVE) ×1 IMPLANT
GLOVE SURG TRIUMPH 8.0 PF LTX (GLOVE) ×3 IMPLANT
GOWN STRL REUS W/ TWL LRG LVL3 (GOWN DISPOSABLE) ×2 IMPLANT
GOWN STRL REUS W/TWL LRG LVL3 (GOWN DISPOSABLE) ×6
LENS IOL ACRSF VT TRC 315 18.5 IMPLANT
LENS IOL ACRYSOF VIVITY 18.5 ×3 IMPLANT
LENS IOL VIVITY 315 18.5 ×1 IMPLANT
MARKER SKIN DUAL TIP RULER LAB (MISCELLANEOUS) ×3 IMPLANT
NDL FILTER BLUNT 18X1 1/2 (NEEDLE) ×1 IMPLANT
NEEDLE FILTER BLUNT 18X 1/2SAF (NEEDLE) ×2
NEEDLE FILTER BLUNT 18X1 1/2 (NEEDLE) ×1 IMPLANT
PACK EYE AFTER SURG (MISCELLANEOUS) ×3 IMPLANT
PACK OPTHALMIC (MISCELLANEOUS) ×3 IMPLANT
PACK PORFILIO (MISCELLANEOUS) ×3 IMPLANT
SYR 3ML LL SCALE MARK (SYRINGE) ×3 IMPLANT
SYR TB 1ML LUER SLIP (SYRINGE) ×3 IMPLANT
WATER STERILE IRR 250ML POUR (IV SOLUTION) ×3 IMPLANT
WIPE NON LINTING 3.25X3.25 (MISCELLANEOUS) ×3 IMPLANT

## 2020-03-19 NOTE — Op Note (Signed)
PREOPERATIVE DIAGNOSIS:  Nuclear sclerotic cataract of the right eye.   POSTOPERATIVE DIAGNOSIS:  Nuclear sclerotic cataract of the right eye.   OPERATIVE PROCEDURE: Procedure(s): CATARACT EXTRACTION PHACO AND INTRAOCULAR LENS PLACEMENT (IOC) RIGHT VIVITY LENS 10.47  00:57.9   SURGEON:  Birder Robson, MD.   ANESTHESIA: 1.      Managed anesthesia care. 2.     0.66ml of Shugarcaine was instilled following the paracentesis  Anesthesiologist: Fidel Levy, MD CRNA: Vanetta Shawl, CRNA  COMPLICATIONS:  None.   TECHNIQUE:   Stop and chop    DESCRIPTION OF PROCEDURE:  The patient was examined and consented in the preoperative holding area where the aforementioned topical anesthesia was applied to the right eye.  The patient was brought back to the Operating Room where he was sat upright on the gurney and given a target to fixate upon while the eye was marked at the 3:00 and 9:00 position.  The patient was then reclined on the operating table.  The eye was prepped and draped in the usual sterile ophthalmic fashion and a lid speculum was placed. A paracentesis was created with the side port blade and the anterior chamber was filled with viscoelastic. A near clear corneal incision was performed with the steel keratome. A continuous curvilinear capsulorrhexis was performed with a cystotome followed by the capsulorrhexis forceps. Hydrodissection and hydrodelineation were carried out with BSS on a blunt cannula. The lens was removed in a stop and chop technique and the remaining cortical material was removed with the irrigation-aspiration handpiece. The eye was inflated with viscoelastic and the DFT  lens  was placed in the eye and rotated to within a few degrees of the predetermined orientation.  The remaining viscoelastic was removed from the eye.  The S[place] hook was used to rotate the toric lens into its final resting place at 030 degrees.  0. The eye was inflated to a physiologic pressure and  found to be watertight. 0.15ml of Vigamox was placed in the anterior chamber.  The eye was dressed with Vigamox. The patient was given protective glasses to wear throughout the day and a shield with which to sleep tonight. The patient was also given drops with which to begin a drop regimen today and will follow-up with me in one day. Implant Name Type Inv. Item Serial No. Manufacturer Lot No. LRB No. Used Action  ACRYSOF IQ VIVITY TORIC IOL Intraocular Lens  [id number]   Right 1 Implanted   Procedure(s): CATARACT EXTRACTION PHACO AND INTRAOCULAR LENS PLACEMENT (IOC) RIGHT VIVITY LENS 10.47  00:57.9 (Right)  Electronically signed: Birder Robson 03/19/2020 9:23 AM

## 2020-03-19 NOTE — Anesthesia Procedure Notes (Signed)
Procedure Name: MAC Date/Time: 03/19/2020 8:49 AM Performed by: Vanetta Shawl, CRNA Pre-anesthesia Checklist: Patient identified, Emergency Drugs available, Suction available, Timeout performed and Patient being monitored Patient Re-evaluated:Patient Re-evaluated prior to induction Oxygen Delivery Method: Nasal cannula Placement Confirmation: positive ETCO2

## 2020-03-19 NOTE — Anesthesia Preprocedure Evaluation (Signed)
Anesthesia Evaluation  Patient identified by MRN, date of birth, ID band Patient awake    Reviewed: NPO status   History of Anesthesia Complications Negative for: history of anesthetic complications  Airway Mallampati: II  TM Distance: >3 FB Neck ROM: full    Dental no notable dental hx.    Pulmonary neg pulmonary ROS,    Pulmonary exam normal        Cardiovascular Exercise Tolerance: Good hypertension, Normal cardiovascular exam     Neuro/Psych ?glaucoma negative neurological ROS  negative psych ROS   GI/Hepatic Neg liver ROS, GERD  Controlled,  Endo/Other  Morbid obesity (bmi 28)  Renal/GU negative Renal ROS   bph    Musculoskeletal   Abdominal   Peds  Hematology negative hematology ROS (+)   Anesthesia Other Findings Covid: NEG.  Reproductive/Obstetrics                             Anesthesia Physical Anesthesia Plan  ASA: II  Anesthesia Plan: MAC   Post-op Pain Management:    Induction:   PONV Risk Score and Plan: 1 and TIVA  Airway Management Planned:   Additional Equipment:   Intra-op Plan:   Post-operative Plan:   Informed Consent: I have reviewed the patients History and Physical, chart, labs and discussed the procedure including the risks, benefits and alternatives for the proposed anesthesia with the patient or authorized representative who has indicated his/her understanding and acceptance.       Plan Discussed with: CRNA  Anesthesia Plan Comments:         Anesthesia Quick Evaluation

## 2020-03-19 NOTE — Anesthesia Postprocedure Evaluation (Signed)
Anesthesia Post Note  Patient: Raymond Kirby [facility]  Procedure(s) Performed: CATARACT EXTRACTION PHACO AND INTRAOCULAR LENS PLACEMENT (IOC) RIGHT VIVITY LENS 10.47  00:57.9 (Right Eye)     Patient location during evaluation: PACU Anesthesia Type: MAC Level of consciousness: awake and alert Pain management: pain level controlled Vital Signs Assessment: post-procedure vital signs reviewed and stable Respiratory status: spontaneous breathing, nonlabored ventilation, respiratory function stable and patient connected to nasal cannula oxygen Cardiovascular status: stable and blood pressure returned to baseline Postop Assessment: no apparent nausea or vomiting Anesthetic complications: no    Savonna Birchmeier

## 2020-03-19 NOTE — Transfer of Care (Signed)
Immediate Anesthesia Transfer of Care Note  Patient: Raymond Kirby [facility]  Procedure(s) Performed: CATARACT EXTRACTION PHACO AND INTRAOCULAR LENS PLACEMENT (IOC) RIGHT VIVITY LENS 10.47  00:57.9 (Right Eye)  Patient Location: PACU  Anesthesia Type: MAC  Level of Consciousness: awake, alert  and patient cooperative  Airway and Oxygen Therapy: Patient Spontanous Breathing   Post-op Assessment: Post-op Vital signs reviewed, Patient's Cardiovascular Status Stable, Respiratory Function Stable, Patent Airway and No signs of Nausea or vomiting  Post-op Vital Signs: Reviewed and stable  Complications: No apparent anesthesia complications

## 2020-03-19 NOTE — H&P (Signed)
All labs reviewed. Abnormal studies sent to patients PCP when indicated.  Previous H&P reviewed, patient examined, there are NO CHANGES.  Raymond Trombetta Porfilio5/4/20218:41 AM

## 2020-03-20 ENCOUNTER — Encounter: Payer: Self-pay | Admitting: *Deleted

## 2020-04-04 ENCOUNTER — Other Ambulatory Visit: Payer: Self-pay

## 2020-04-04 ENCOUNTER — Encounter: Payer: Self-pay | Admitting: Ophthalmology

## 2020-04-05 DIAGNOSIS — H2512 Age-related nuclear cataract, left eye: Secondary | ICD-10-CM | POA: Diagnosis not present

## 2020-04-05 DIAGNOSIS — I10 Essential (primary) hypertension: Secondary | ICD-10-CM | POA: Diagnosis not present

## 2020-04-11 NOTE — Discharge Instructions (Signed)
General Anesthesia, Adult, Care After This sheet gives you information about how to care for yourself after your procedure. Your health care provider may also give you more specific instructions. If you have problems or questions, contact your health care provider. What can I expect after the procedure? After the procedure, the following side effects are common:  Pain or discomfort at the IV site.  Nausea.  Vomiting.  Sore throat.  Trouble concentrating.  Feeling cold or chills.  Weak or tired.  Sleepiness and fatigue.  Soreness and body aches. These side effects can affect parts of the body that were not involved in surgery. Follow these instructions at home:  For at least 24 hours after the procedure:  Have a responsible adult stay with you. It is important to have someone help care for you until you are awake and alert.  Rest as needed.  Do not: ? Participate in activities in which you could fall or become injured. ? Drive. ? Use heavy machinery. ? Drink alcohol. ? Take sleeping pills or medicines that cause drowsiness. ? Make important decisions or sign legal documents. ? Take care of children on your own. Eating and drinking  Follow any instructions from your health care provider about eating or drinking restrictions.  When you feel hungry, start by eating small amounts of foods that are soft and easy to digest (bland), such as toast. Gradually return to your regular diet.  Drink enough fluid to keep your urine pale yellow.  If you vomit, rehydrate by drinking water, juice, or clear broth. General instructions  If you have sleep apnea, surgery and certain medicines can increase your risk for breathing problems. Follow instructions from your health care provider about wearing your sleep device: ? Anytime you are sleeping, including during daytime naps. ? While taking prescription pain medicines, sleeping medicines, or medicines that make you drowsy.  Return to  your normal activities as told by your health care provider. Ask your health care provider what activities are safe for you.  Take over-the-counter and prescription medicines only as told by your health care provider.  If you smoke, do not smoke without supervision.  Keep all follow-up visits as told by your health care provider. This is important. Contact a health care provider if:  You have nausea or vomiting that does not get better with medicine.  You cannot eat or drink without vomiting.  You have pain that does not get better with medicine.  You are unable to pass urine.  You develop a skin rash.  You have a fever.  You have redness around your IV site that gets worse. Get help right away if:  You have difficulty breathing.  You have chest pain.  You have blood in your urine or stool, or you vomit blood. Summary  After the procedure, it is common to have a sore throat or nausea. It is also common to feel tired.  Have a responsible adult stay with you for the first 24 hours after general anesthesia. It is important to have someone help care for you until you are awake and alert.  When you feel hungry, start by eating small amounts of foods that are soft and easy to digest (bland), such as toast. Gradually return to your regular diet.  Drink enough fluid to keep your urine pale yellow.  Return to your normal activities as told by your health care provider. Ask your health care provider what activities are safe for you. This information is not   intended to replace advice given to you by your health care provider. Make sure you discuss any questions you have with your health care provider. Document Revised: 11/05/2017 Document Reviewed: 06/18/2017 Elsevier Patient Education  2020 Elsevier Inc.  Cataract Surgery, Care After This sheet gives you information about how to care for yourself after your procedure. Your health care provider may also give you more specific  instructions. If you have problems or questions, contact your health care provider. What can I expect after the procedure? After the procedure, it is common to have:  Itching.  Discomfort.  Fluid discharge.  Sensitivity to light and to touch.  Bruising in or around the eye.  Mild blurred vision. Follow these instructions at home: Eye care   Do not touch or rub your eyes.  Protect your eyes as told by your health care provider. You may be told to wear a protective eye shield or sunglasses.  Do not put a contact lens into the affected eye or eyes until your health care provider approves.  Keep the area around your eye clean and dry: ? Avoid swimming. ? Do not allow water to hit you directly in the face while showering. ? Keep soap and shampoo out of your eyes.  Check your eye every day for signs of infection. Watch for: ? Redness, swelling, or pain. ? Fluid, blood, or pus. ? Warmth. ? A bad smell. ? Vision that is getting worse. ? Sensitivity that is getting worse. Activity  Do not drive for 24 hours if you were given a sedative during your procedure.  Avoid strenuous activities, such as playing contact sports, for as long as told by your health care provider.  Do not drive or use heavy machinery until your health care provider approves.  Do not bend or lift heavy objects. Bending increases pressure in the eye. You can walk, climb stairs, and do light household chores.  Ask your health care provider when you can return to work. If you work in a dusty environment, you may be advised to wear protective eyewear for a period of time. General instructions  Take or apply over-the-counter and prescription medicines only as told by your health care provider. This includes eye drops.  Keep all follow-up visits as told by your health care provider. This is important. Contact a health care provider if:  You have increased bruising around your eye.  You have pain that is  not helped with medicine.  You have a fever.  You have redness, swelling, or pain in your eye.  You have fluid, blood, or pus coming from your incision.  Your vision gets worse.  Your sensitivity to light gets worse. Get help right away if:  You have sudden loss of vision.  You see flashes of light or spots (floaters).  You have severe eye pain.  You develop nausea or vomiting. Summary  After your procedure, it is common to have itching, discomfort, bruising, fluid discharge, or sensitivity to light.  Follow instructions from your health care provider about caring for your eye after the procedure.  Do not rub your eye after the procedure. You may need to wear eye protection or sunglasses. Do not wear contact lenses. Keep the area around your eye clean and dry.  Avoid activities that require a lot of effort. These include playing sports and lifting heavy objects.  Contact a health care provider if you have increased bruising, pain that does not go away, or a fever. Get help right   away if you suddenly lose your vision, see flashes of light or spots, or have severe pain in the eye. This information is not intended to replace advice given to you by your health care provider. Make sure you discuss any questions you have with your health care provider. Document Revised: 08/29/2019 Document Reviewed: 05/02/2018 Elsevier Patient Education  2020 Elsevier Inc.  

## 2020-04-12 ENCOUNTER — Other Ambulatory Visit
Admission: RE | Admit: 2020-04-12 | Discharge: 2020-04-12 | Disposition: A | Discharge status: Home / Self Care | Payer: PPO | Source: Ambulatory Visit | Attending: Ophthalmology | Admitting: Ophthalmology

## 2020-04-12 ENCOUNTER — Other Ambulatory Visit: Payer: Self-pay

## 2020-04-12 DIAGNOSIS — Z01812 Encounter for preprocedural laboratory examination: Secondary | ICD-10-CM | POA: Diagnosis not present

## 2020-04-12 DIAGNOSIS — Z20822 Contact with and (suspected) exposure to covid-19: Secondary | ICD-10-CM | POA: Insufficient documentation

## 2020-04-13 LAB — SARS CORONAVIRUS 2 (TAT 6-24 HRS): SARS Coronavirus 2: NEGATIVE

## 2020-04-16 ENCOUNTER — Encounter
Admission: RE | Disposition: A | Discharge status: Home / Self Care | Payer: Self-pay | Source: Home / Self Care | Attending: Ophthalmology

## 2020-04-16 ENCOUNTER — Ambulatory Visit
Admission: RE | Admit: 2020-04-16 | Discharge: 2020-04-16 | Disposition: A | Discharge status: Home / Self Care | Payer: PPO | Attending: Ophthalmology | Admitting: Ophthalmology

## 2020-04-16 ENCOUNTER — Encounter: Payer: Self-pay | Admitting: Ophthalmology

## 2020-04-16 ENCOUNTER — Ambulatory Visit: Payer: PPO | Admitting: Anesthesiology

## 2020-04-16 ENCOUNTER — Other Ambulatory Visit: Payer: Self-pay

## 2020-04-16 DIAGNOSIS — E7800 Pure hypercholesterolemia, unspecified: Secondary | ICD-10-CM | POA: Insufficient documentation

## 2020-04-16 DIAGNOSIS — N400 Enlarged prostate without lower urinary tract symptoms: Secondary | ICD-10-CM | POA: Diagnosis not present

## 2020-04-16 DIAGNOSIS — H25812 Combined forms of age-related cataract, left eye: Secondary | ICD-10-CM | POA: Diagnosis not present

## 2020-04-16 DIAGNOSIS — Z79899 Other long term (current) drug therapy: Secondary | ICD-10-CM | POA: Diagnosis not present

## 2020-04-16 DIAGNOSIS — H2512 Age-related nuclear cataract, left eye: Secondary | ICD-10-CM | POA: Diagnosis not present

## 2020-04-16 DIAGNOSIS — I10 Essential (primary) hypertension: Secondary | ICD-10-CM | POA: Insufficient documentation

## 2020-04-16 DIAGNOSIS — K219 Gastro-esophageal reflux disease without esophagitis: Secondary | ICD-10-CM | POA: Insufficient documentation

## 2020-04-16 HISTORY — PX: CATARACT EXTRACTION W/PHACO: SHX586

## 2020-04-16 SURGERY — PHACOEMULSIFICATION, CATARACT, WITH IOL INSERTION
Anesthesia: Monitor Anesthesia Care | Site: Eye | Laterality: Left

## 2020-04-16 MED ORDER — LIDOCAINE HCL (PF) 2 % IJ SOLN
INTRAOCULAR | Status: DC | PRN
  Administered 2020-04-16: 1 mL

## 2020-04-16 MED ORDER — ACETAMINOPHEN 325 MG PO TABS: 325.0000 mg | ORAL_TABLET | ORAL | Status: DC | PRN

## 2020-04-16 MED ORDER — ACETAMINOPHEN 160 MG/5ML PO SOLN: 325.0000 mg | ORAL | Status: DC | PRN

## 2020-04-16 MED ORDER — EPINEPHRINE PF 1 MG/ML IJ SOLN
INTRAOCULAR | Status: DC | PRN
  Administered 2020-04-16: 45 mL via OPHTHALMIC

## 2020-04-16 MED ORDER — MIDAZOLAM HCL 2 MG/2ML IJ SOLN
INTRAMUSCULAR | Status: DC | PRN
  Administered 2020-04-16 (×2): 1 mg via INTRAVENOUS

## 2020-04-16 MED ORDER — NA CHONDROIT SULF-NA HYALURON 40-17 MG/ML IO SOLN
INTRAOCULAR | Status: DC | PRN
  Administered 2020-04-16: 1 mL via INTRAOCULAR

## 2020-04-16 MED ORDER — ARMC OPHTHALMIC DILATING DROPS
1.0000 "application " | OPHTHALMIC | Status: DC | PRN
  Administered 2020-04-16 (×3): 1 via OPHTHALMIC

## 2020-04-16 MED ORDER — MOXIFLOXACIN HCL 0.5 % OP SOLN
OPHTHALMIC | Status: DC | PRN
  Administered 2020-04-16: 0.2 mL via OPHTHALMIC

## 2020-04-16 MED ORDER — TETRACAINE HCL 0.5 % OP SOLN
1.0000 [drp] | OPHTHALMIC | Status: DC | PRN
  Administered 2020-04-16 (×3): 1 [drp] via OPHTHALMIC

## 2020-04-16 MED ORDER — BRIMONIDINE TARTRATE-TIMOLOL 0.2-0.5 % OP SOLN
OPHTHALMIC | Status: DC | PRN
  Administered 2020-04-16: 1 [drp] via OPHTHALMIC

## 2020-04-16 MED ORDER — ONDANSETRON HCL 4 MG/2ML IJ SOLN: 4.0000 mg | Freq: Once | INTRAMUSCULAR | Status: DC | PRN

## 2020-04-16 MED ORDER — FENTANYL CITRATE (PF) 100 MCG/2ML IJ SOLN
INTRAMUSCULAR | Status: DC | PRN
  Administered 2020-04-16: 50 ug via INTRAVENOUS

## 2020-04-16 SURGICAL SUPPLY — 23 items
CANNULA ANT/CHMB 27G (MISCELLANEOUS) ×2 IMPLANT
CANNULA ANT/CHMB 27GA (MISCELLANEOUS) ×6 IMPLANT
GLOVE SURG LX 8.0 MICRO (GLOVE) ×4
GLOVE SURG LX STRL 8.0 MICRO (GLOVE) ×1 IMPLANT
GLOVE SURG TRIUMPH 8.0 PF LTX (GLOVE) ×3 IMPLANT
GOWN STRL REUS W/ TWL LRG LVL3 (GOWN DISPOSABLE) ×2 IMPLANT
GOWN STRL REUS W/TWL LRG LVL3 (GOWN DISPOSABLE) ×6
LENS IOL ACRSF IQ VT 15 18.5 IMPLANT
LENS IOL ACRYSOF VIVITY 18.5 ×3 IMPLANT
LENS IOL VIVITY 015 18.5 ×1 IMPLANT
MARKER SKIN DUAL TIP RULER LAB (MISCELLANEOUS) ×3 IMPLANT
NDL FILTER BLUNT 18X1 1/2 (NEEDLE) ×1 IMPLANT
NEEDLE FILTER BLUNT 18X 1/2SAF (NEEDLE) ×2
NEEDLE FILTER BLUNT 18X1 1/2 (NEEDLE) ×1 IMPLANT
PACK EYE AFTER SURG (MISCELLANEOUS) ×3 IMPLANT
PACK OPTHALMIC (MISCELLANEOUS) ×3 IMPLANT
PACK PORFILIO (MISCELLANEOUS) ×3 IMPLANT
SUT ETHILON 10-0 CS-B-6CS-B-6 (SUTURE)
SUTURE EHLN 10-0 CS-B-6CS-B-6 (SUTURE) IMPLANT
SYR 3ML LL SCALE MARK (SYRINGE) ×3 IMPLANT
SYR TB 1ML LUER SLIP (SYRINGE) ×3 IMPLANT
WATER STERILE IRR 250ML POUR (IV SOLUTION) ×3 IMPLANT
WIPE NON LINTING 3.25X3.25 (MISCELLANEOUS) ×3 IMPLANT

## 2020-04-16 NOTE — Anesthesia Preprocedure Evaluation (Signed)
Anesthesia Evaluation  Patient identified by MRN, date of birth, ID band Patient awake    Reviewed: NPO status   History of Anesthesia Complications Negative for: history of anesthetic complications  Airway Mallampati: II  TM Distance: >3 FB Neck ROM: full    Dental no notable dental hx.    Pulmonary neg pulmonary ROS,    Pulmonary exam normal        Cardiovascular Exercise Tolerance: Good hypertension,  Rhythm:Regular Rate:Normal     Neuro/Psych ?glaucoma    GI/Hepatic GERD  Controlled,  Endo/Other  Morbid obesity (bmi 28)  Renal/GU    bph    Musculoskeletal   Abdominal   Peds  Hematology   Anesthesia Other Findings Covid: NEG.  Reproductive/Obstetrics                             Anesthesia Physical  Anesthesia Plan  ASA: II  Anesthesia Plan: MAC   Post-op Pain Management:    Induction:   PONV Risk Score and Plan: 1 and TIVA and Treatment may vary due to age or medical condition  Airway Management Planned:   Additional Equipment:   Intra-op Plan:   Post-operative Plan:   Informed Consent: I have reviewed the patients History and Physical, chart, labs and discussed the procedure including the risks, benefits and alternatives for the proposed anesthesia with the patient or authorized representative who has indicated his/her understanding and acceptance.       Plan Discussed with: CRNA  Anesthesia Plan Comments:         Anesthesia Quick Evaluation

## 2020-04-16 NOTE — Anesthesia Postprocedure Evaluation (Signed)
Anesthesia Post Note  Patient: Raymond Kirby [facility]  Procedure(s) Performed: CATARACT EXTRACTION PHACO AND INTRAOCULAR LENS PLACEMENT (IOC) LEFT VIVITY TORIC LENS (Left Eye)     Patient location during evaluation: PACU Anesthesia Type: MAC Level of consciousness: awake Pain management: pain level controlled Vital Signs Assessment: post-procedure vital signs reviewed and stable Respiratory status: respiratory function stable Cardiovascular status: stable Postop Assessment: no apparent nausea or vomiting Anesthetic complications: no    Veda Canning

## 2020-04-16 NOTE — H&P (Signed)
All labs reviewed. Abnormal studies sent to patients PCP when indicated.  Previous H&P reviewed, patient examined, there are NO CHANGES.  Gwyndolyn Saxon Porfilio6/1/20211:08 PM

## 2020-04-16 NOTE — Transfer of Care (Signed)
Immediate Anesthesia Transfer of Care Note  Patient: Raymond Kirby [facility]  Procedure(s) Performed: CATARACT EXTRACTION PHACO AND INTRAOCULAR LENS PLACEMENT (IOC) LEFT VIVITY TORIC LENS (Left Eye)  Patient Location: PACU  Anesthesia Type: MAC  Level of Consciousness: awake, alert  and patient cooperative  Airway and Oxygen Therapy: Patient Spontanous Breathing and Patient connected to supplemental oxygen  Post-op Assessment: Post-op Vital signs reviewed, Patient's Cardiovascular Status Stable, Respiratory Function Stable, Patent Airway and No signs of Nausea or vomiting  Post-op Vital Signs: Reviewed and stable  Complications: No apparent anesthesia complications

## 2020-04-16 NOTE — H&P (Signed)
All labs reviewed. Abnormal studies sent to patients PCP when indicated.  Previous H&P reviewed, patient examined, there are NO CHANGES.  Raymond Marszalek Porfilio6/1/202112:17 PM

## 2020-04-16 NOTE — Anesthesia Procedure Notes (Signed)
Procedure Name: MAC Date/Time: 04/16/2020 1:16 PM Performed by: Cameron Ali, CRNA Pre-anesthesia Checklist: Patient identified, Emergency Drugs available, Suction available, Timeout performed and Patient being monitored Patient Re-evaluated:Patient Re-evaluated prior to induction Oxygen Delivery Method: Nasal cannula Placement Confirmation: positive ETCO2

## 2020-04-16 NOTE — Op Note (Signed)
PREOPERATIVE DIAGNOSIS:  Nuclear sclerotic cataract of the left eye.   POSTOPERATIVE DIAGNOSIS:  Nuclear sclerotic cataract of the left eye.   OPERATIVE PROCEDURE:@   SURGEON:  Birder Robson, MD.   ANESTHESIA:  Anesthesiologist: Veda Canning, MD CRNA: Cameron Ali, CRNA  1.      Managed anesthesia care. 2.     0.40ml of Shugarcaine was instilled following the paracentesis   COMPLICATIONS:  None.   TECHNIQUE:   Stop and chop   DESCRIPTION OF PROCEDURE:  The patient was examined and consented in the preoperative holding area where the aforementioned topical anesthesia was applied to the left eye and then brought back to the Operating Room where the left eye was prepped and draped in the usual sterile ophthalmic fashion and a lid speculum was placed. A paracentesis was created with the side port blade and the anterior chamber was filled with viscoelastic. A near clear corneal incision was performed with the steel keratome. A continuous curvilinear capsulorrhexis was performed with a cystotome followed by the capsulorrhexis forceps. Hydrodissection and hydrodelineation were carried out with BSS on a blunt cannula. The lens was removed in a stop and chop  technique and the remaining cortical material was removed with the irrigation-aspiration handpiece. The capsular bag was inflated with viscoelastic and the Technis [id number] lens was placed in the capsular bag without complication. The remaining viscoelastic was removed from the eye with the irrigation-aspiration handpiece. The wounds were hydrated. The anterior chamber was flushed with BSS and the eye was inflated to physiologic pressure. 0.52ml Vigamox was placed in the anterior chamber. The wounds were found to be water tight. The eye was dressed with Combigan. The patient was given protective glasses to wear throughout the day and a shield with which to sleep tonight. The patient was also given drops with which to begin a drop regimen today and  will follow-up with me in one day. Implant Name Type Inv. Item Serial No. Manufacturer Lot No. LRB No. Used Action  ALCON ACRYSOF IQ VIVITY IOL Intraocular Lens  [id number] ALCON  Left 1 Implanted    Procedure(s) with comments: CATARACT EXTRACTION PHACO AND INTRAOCULAR LENS PLACEMENT (IOC) LEFT VIVITY TORIC LENS (Left) - 5.36 0:32.3  Electronically signed: Birder Robson 04/16/2020 2:38 PM

## 2020-04-17 ENCOUNTER — Encounter: Payer: Self-pay | Admitting: *Deleted

## 2020-08-28 ENCOUNTER — Other Ambulatory Visit: Payer: Self-pay | Admitting: Family Medicine

## 2020-10-07 ENCOUNTER — Ambulatory Visit: Payer: PPO | Attending: Internal Medicine

## 2020-10-07 DIAGNOSIS — Z23 Encounter for immunization: Secondary | ICD-10-CM

## 2020-10-07 NOTE — Progress Notes (Signed)
   Covid-19 Vaccination Clinic  Name:  Raymond Kirby    MRN: [id number] DOB: 1946-07-31  10/07/2020  [name] was observed post Covid-19 immunization for 15 minutes without incident. He was provided with Vaccine Information Sheet and instruction to access the V-Safe system.   [name] was instructed to call 911 with any severe reactions post vaccine: Marland Kitchen Difficulty breathing  . Swelling of face and throat  . A fast heartbeat  . A bad rash all over body  . Dizziness and weakness   Immunizations Administered    Name Date Dose VIS Date Route   Pfizer COVID-19 Vaccine 10/07/2020  3:49 PM 0.3 mL 09/04/2020 Intramuscular   Manufacturer: [place]   Lot: [id number]   [place]: [id number]

## 2020-10-28 ENCOUNTER — Telehealth (INDEPENDENT_AMBULATORY_CARE_PROVIDER_SITE_OTHER): Payer: PPO | Admitting: Family Medicine

## 2020-10-28 VITALS — Ht 70.0 in | Wt 204.0 lb

## 2020-10-28 DIAGNOSIS — R059 Cough, unspecified: Secondary | ICD-10-CM

## 2020-10-28 MED ORDER — HYDROCODONE-HOMATROPINE 5-1.5 MG/5ML PO SYRP: 5.0000 mL | ORAL_SOLUTION | Freq: Three times a day (TID) | ORAL | 0 refills | Status: DC | PRN

## 2020-10-28 NOTE — Progress Notes (Signed)
Interactive audio and video telecommunications were attempted between this provider and patient, however failed, due to patient having technical difficulties OR patient did not have access to video capability.  We continued and completed visit with audio only.   Virtual Visit via Telephone Note  I connected with patient on 10/28/20  at 4:27 PM  by telephone and verified that I am speaking with the correct person using two identifiers.  Location of patient: home  Location of MD: [place] Name of referring provider (if blank then none associated): Names per persons and role in encounter:  MD: Earlyne Iba, Patient: name listed above.    I discussed the limitations, risks, security and privacy concerns of performing an evaluation and management service by telephone and the availability of in person appointments. I also discussed with the patient that there may be a patient responsible charge related to this service. The patient expressed understanding and agreed to proceed.  CC: URI sx.    History of Present Illness:  Sx started about 4 days ago.  Initially with cough.  Started guaifenesin and that helped some in the meantime.   He had some leftover hydrocodone cough syrup, used with relief.  No fevers.  No fevers.  No vomiting.  Some yellowish sputum, esp in the AM but less later in the day.  No wheeze- expectorant helped.  No facial or ear pain.  Eating and drinking well.  He is increasing his fluid intake.  No rash.  No change in taste or smell.  He took a covid test, negative.  He had vaccine and booster.  No maxillary pain.  He is some better today compared to prev.     Observations/Objective: nad Speech normal  Assessment and Plan: Cough.  Likely a benign issue that should resolve. Continue expectorant, rx sent for hycodan, sedation caution  Rest and fluids.  Wouldn't start abx.  Update me as needed.  He agrees.   Follow Up Instructions: see above.    I discussed the  assessment and treatment plan with the patient. The patient was provided an opportunity to ask questions and all were answered. The patient agreed with the plan and demonstrated an understanding of the instructions.   The patient was advised to call back or seek an in-person evaluation if the symptoms worsen or if the condition fails to improve as anticipated.  I provided 12 minutes of non-face-to-face time during this encounter.  Elsie Stain, MD

## 2020-10-30 NOTE — Assessment & Plan Note (Signed)
Cough.  Likely a benign issue that should resolve. Continue expectorant, rx sent for hycodan, sedation caution  Rest and fluids.  Wouldn't start abx.  Update me as needed.  He agrees.

## 2020-11-13 ENCOUNTER — Ambulatory Visit (AMBULATORY_SURGERY_CENTER): Payer: Self-pay | Admitting: *Deleted

## 2020-11-13 ENCOUNTER — Other Ambulatory Visit: Payer: Self-pay

## 2020-11-13 VITALS — Ht 70.0 in | Wt 201.0 lb

## 2020-11-13 DIAGNOSIS — Z86010 Personal history of colonic polyps: Secondary | ICD-10-CM

## 2020-11-13 MED ORDER — SUTAB 1479-225-188 MG PO TABS: 24.0000 | ORAL_TABLET | ORAL | 0 refills | Status: DC

## 2020-11-13 NOTE — Progress Notes (Signed)
No egg or soy allergy known to patient  No issues with past sedation with any surgeries or procedures No intubation problems in the past  No FH of Malignant Hyperthermia No diet pills per patient No home 02 use per patient  No blood thinners per patient  Pt denies issues with constipation  No A fib or A flutter  EMMI video to pt or via MyChart  COVID 19 guidelines implemented in PV today with Pt and RN  Pt is fully vaccinated  for [employer] given to pt in PV today , Code to Pharmacy - explained about no PA for prep in PV today   Due to the COVID-19 pandemic we are asking patients to follow certain guidelines.  Pt aware of COVID protocols and [facility] guidelines

## 2020-11-22 DIAGNOSIS — H43811 Vitreous degeneration, right eye: Secondary | ICD-10-CM | POA: Diagnosis not present

## 2020-12-06 ENCOUNTER — Encounter: Payer: Self-pay | Admitting: Gastroenterology

## 2020-12-07 ENCOUNTER — Other Ambulatory Visit: Payer: Self-pay | Admitting: Family Medicine

## 2020-12-12 ENCOUNTER — Other Ambulatory Visit: Payer: Self-pay

## 2020-12-12 ENCOUNTER — Ambulatory Visit (AMBULATORY_SURGERY_CENTER): Payer: PPO | Admitting: Gastroenterology

## 2020-12-12 ENCOUNTER — Encounter: Payer: Self-pay | Admitting: Gastroenterology

## 2020-12-12 VITALS — BP 130/80 | HR 76 | Temp 98.9°F | Resp 13 | Ht 70.0 in | Wt 201.0 lb

## 2020-12-12 DIAGNOSIS — D124 Benign neoplasm of descending colon: Secondary | ICD-10-CM

## 2020-12-12 DIAGNOSIS — Z86010 Personal history of colonic polyps: Secondary | ICD-10-CM | POA: Diagnosis not present

## 2020-12-12 DIAGNOSIS — D127 Benign neoplasm of rectosigmoid junction: Secondary | ICD-10-CM

## 2020-12-12 DIAGNOSIS — D120 Benign neoplasm of cecum: Secondary | ICD-10-CM

## 2020-12-12 MED ORDER — SODIUM CHLORIDE 0.9 % IV SOLN: 500.0000 mL | INTRAVENOUS | Status: DC

## 2020-12-12 NOTE — Patient Instructions (Signed)
Handouts given for polyps, diverticulosis and high fiber diet.  YOU HAD AN ENDOSCOPIC PROCEDURE TODAY AT [facility]:   Refer to the procedure report that was given to you for any specific questions about what was found during the examination.  If the procedure report does not answer your questions, please call your gastroenterologist to clarify.  If you requested that your care partner not be given the details of your procedure findings, then the procedure report has been included in a sealed envelope for you to review at your convenience later.  YOU SHOULD EXPECT: Some feelings of bloating in the abdomen. Passage of more gas than usual.  Walking can help get rid of the air that was put into your GI tract during the procedure and reduce the bloating. If you had a lower endoscopy (such as a colonoscopy or flexible sigmoidoscopy) you may notice spotting of blood in your stool or on the toilet paper. If you underwent a bowel prep for your procedure, you may not have a normal bowel movement for a few days.  Please Note:  You might notice some irritation and congestion in your nose or some drainage.  This is from the oxygen used during your procedure.  There is no need for concern and it should clear up in a day or so.  SYMPTOMS TO REPORT IMMEDIATELY:   Following lower endoscopy (colonoscopy or flexible sigmoidoscopy):  Excessive amounts of blood in the stool  Significant tenderness or worsening of abdominal pains  Swelling of the abdomen that is new, acute  Fever of 100F or higher  For urgent or emergent issues, a gastroenterologist can be reached at any hour by calling [phone]. Do not use MyChart messaging for urgent concerns.    DIET:  We do recommend a small meal at first, but then you may proceed to your regular diet.  Drink plenty of fluids but you should avoid alcoholic beverages for 24 hours.  ACTIVITY:  You should plan to take it easy for the rest of today and you  should NOT DRIVE or use heavy machinery until tomorrow (because of the sedation medicines used during the test).    FOLLOW UP: Our staff will call the number listed on your records 48-72 hours following your procedure to check on you and address any questions or concerns that you may have regarding the information given to you following your procedure. If we do not reach you, we will leave a message.  We will attempt to reach you two times.  During this call, we will ask if you have developed any symptoms of COVID 19. If you develop any symptoms (ie: fever, flu-like symptoms, shortness of breath, cough etc.) before then, please call [phone].  If you test positive for Covid 19 in the 2 weeks post procedure, please call and report this information to [place].    If any biopsies were taken you will be contacted by phone or by letter within the next 1-3 weeks.  Please call us at [phone] if you have not heard about the biopsies in 3 weeks.    SIGNATURES/CONFIDENTIALITY: You and/or your care partner have signed paperwork which will be entered into your electronic medical record.  These signatures attest to the fact that that the information above on your After Visit Summary has been reviewed and is understood.  Full responsibility of the confidentiality of this discharge information lies with you and/or your care-partner.

## 2020-12-12 NOTE — Op Note (Signed)
[facility] Patient Name: Gunnard Dorrance Procedure Date: 12/12/2020 9:05 AM MRN: [id number] Endoscopist: Remo Lipps P. Havery Moros , MD Age: 75 Referring MD:  Date of Birth: 1946-03-29 Gender: Male Account #: [id number] Procedure:                Colonoscopy Indications:              High risk colon cancer surveillance: Personal                            history of colonic polyps (3 adenomas removed                            11/2016), also with history of hemorrhoids with                            banding x 1 Medicines:                Monitored Anesthesia Care Procedure:                Pre-Anesthesia Assessment:                           - Prior to the procedure, a History and Physical                            was performed, and patient medications and                            allergies were reviewed. The patient's tolerance of                            previous anesthesia was also reviewed. The risks                            and benefits of the procedure and the sedation                            options and risks were discussed with the patient.                            All questions were answered, and informed consent                            was obtained. Prior Anticoagulants: The patient has                            taken no previous anticoagulant or antiplatelet                            agents. ASA Grade Assessment: II - A patient with                            mild systemic disease. After reviewing the risks  and benefits, the patient was deemed in                            satisfactory condition to undergo the procedure.                           After obtaining informed consent, the colonoscope                            was passed under direct vision. Throughout the                            procedure, the patient's blood pressure, pulse, and                            oxygen saturations were monitored continuously. The                             Colonoscope was introduced through the anus and                            advanced to the the cecum, identified by                            appendiceal orifice and ileocecal valve. The                            colonoscopy was performed without difficulty. The                            patient tolerated the procedure well. The quality                            of the bowel preparation was good. The ileocecal                            valve, appendiceal orifice, and rectum were                            photographed. Scope In: 9:20:03 AM Scope Out: 9:41:14 AM Scope Withdrawal Time: 0 hours 18 minutes 43 seconds  Total Procedure Duration: 0 hours 21 minutes 11 seconds  Findings:                 Hemorrhoids and skin tags were found on perianal                            exam.                           A 4 mm polyp was found in the cecum. The polyp was                            flat. The polyp was removed with a cold snare.  Resection and retrieval were complete.                           A 3 mm polyp was found in the descending colon. The                            polyp was sessile. The polyp was removed with a                            cold snare. Resection and retrieval were complete.                           A 4 mm polyp was found in the recto-sigmoid colon.                            The polyp was sessile. The polyp was removed with a                            cold snare. Resection and retrieval were complete.                           A few medium-mouthed diverticula were found in the                            sigmoid colon.                           Internal hemorrhoids were found during                            retroflexion. The hemorrhoids were moderate.                           The exam was otherwise normal throughout the                            examined colon. Complications:            No immediate complications.  Estimated blood loss:                            Minimal. Estimated Blood Loss:     Estimated blood loss was minimal. Impression:               - Hemorrhoids found on perianal exam.                           - One 4 mm polyp in the cecum, removed with a cold                            snare. Resected and retrieved.                           - One 3 mm polyp in the descending colon, removed  with a cold snare. Resected and retrieved.                           - One 4 mm polyp at the recto-sigmoid colon,                            removed with a cold snare. Resected and retrieved.                           - Diverticulosis in the sigmoid colon.                           - Internal hemorrhoids. Recommendation:           - Patient has a contact number available for                            emergencies. The signs and symptoms of potential                            delayed complications were discussed with the                            patient. Return to normal activities tomorrow.                            Written discharge instructions were provided to the                            patient.                           - Resume previous diet.                           - Continue present medications.                           - Await pathology results.                           - If hemorrhoids are causing symptoms, can follow                            up in the clinic for additional banding as needed Remo Lipps P. Armbruster, MD 12/12/2020 9:47:17 AM This report has been signed electronically.

## 2020-12-12 NOTE — Progress Notes (Signed)
VS-CW  Pt's states no medical or surgical changes since previsit or office visit.  

## 2020-12-12 NOTE — Progress Notes (Signed)
pt tolerated well. VSS. awake and to recovery. Report given to RN.  

## 2020-12-16 ENCOUNTER — Telehealth: Payer: Self-pay

## 2020-12-16 NOTE — Telephone Encounter (Signed)
Attempted to reach pt. With follow-up call following endoscopic procedure 12/12/2020.  LM on pt. Ans. Machine.  Will try to reach pt. Again later today.

## 2020-12-16 NOTE — Telephone Encounter (Signed)
  Follow up Call-  Call back number 12/12/2020  Post procedure Call Back phone  # [phone]  Permission to leave phone message Yes  Some recent data might be hidden     Patient questions:  Do you have a fever, pain , or abdominal swelling? No. Pain Score  0 *  Have you tolerated food without any problems? Yes.    Have you been able to return to your normal activities? Yes.    Do you have any questions about your discharge instructions: Diet   No. Medications  No. Follow up visit  No.  Do you have questions or concerns about your Care? No.  Actions: * If pain score is 4 or above: No action needed, pain <4.  1. Have you developed a fever since your procedure? no  2.   Have you had an respiratory symptoms (SOB or cough) since your procedure? no  3.   Have you tested positive for COVID 19 since your procedure no  4.   Have you had any family members/close contacts diagnosed with the COVID 19 since your procedure?  no   If yes to any of these questions please route to Joylene John, RN and Joella Prince, RN

## 2020-12-24 ENCOUNTER — Encounter: Payer: Self-pay | Admitting: Gastroenterology

## 2021-01-12 ENCOUNTER — Other Ambulatory Visit: Payer: Self-pay | Admitting: Family Medicine

## 2021-01-12 DIAGNOSIS — E785 Hyperlipidemia, unspecified: Secondary | ICD-10-CM

## 2021-01-12 DIAGNOSIS — I10 Essential (primary) hypertension: Secondary | ICD-10-CM

## 2021-01-12 DIAGNOSIS — Z125 Encounter for screening for malignant neoplasm of prostate: Secondary | ICD-10-CM

## 2021-01-12 DIAGNOSIS — R739 Hyperglycemia, unspecified: Secondary | ICD-10-CM

## 2021-01-23 ENCOUNTER — Other Ambulatory Visit: Payer: Self-pay

## 2021-01-23 ENCOUNTER — Other Ambulatory Visit (INDEPENDENT_AMBULATORY_CARE_PROVIDER_SITE_OTHER): Payer: PPO

## 2021-01-23 DIAGNOSIS — R739 Hyperglycemia, unspecified: Secondary | ICD-10-CM | POA: Diagnosis not present

## 2021-01-23 DIAGNOSIS — I10 Essential (primary) hypertension: Secondary | ICD-10-CM

## 2021-01-23 DIAGNOSIS — E785 Hyperlipidemia, unspecified: Secondary | ICD-10-CM | POA: Diagnosis not present

## 2021-01-23 DIAGNOSIS — Z125 Encounter for screening for malignant neoplasm of prostate: Secondary | ICD-10-CM | POA: Diagnosis not present

## 2021-01-23 LAB — CBC WITH DIFFERENTIAL/PLATELET
Basophils Absolute: 0.1 10*3/uL (ref 0.0–0.1)
Basophils Relative: 1.3 % (ref 0.0–3.0)
Eosinophils Absolute: 0.4 10*3/uL (ref 0.0–0.7)
Eosinophils Relative: 6.5 % — ABNORMAL HIGH (ref 0.0–5.0)
HCT: 46.6 % (ref 39.0–52.0)
Hemoglobin: 16 g/dL (ref 13.0–17.0)
Lymphocytes Relative: 19.1 % (ref 12.0–46.0)
Lymphs Abs: 1.1 10*3/uL (ref 0.7–4.0)
MCHC: 34.3 g/dL (ref 30.0–36.0)
MCV: 97.7 fl (ref 78.0–100.0)
Monocytes Absolute: 0.6 10*3/uL (ref 0.1–1.0)
Monocytes Relative: 10.6 % (ref 3.0–12.0)
Neutro Abs: 3.5 10*3/uL (ref 1.4–7.7)
Neutrophils Relative %: 62.5 % (ref 43.0–77.0)
Platelets: 197 10*3/uL (ref 150.0–400.0)
RBC: 4.77 Mil/uL (ref 4.22–5.81)
RDW: 13.5 % (ref 11.5–15.5)
WBC: 5.7 10*3/uL (ref 4.0–10.5)

## 2021-01-23 LAB — COMPREHENSIVE METABOLIC PANEL
ALT: 31 U/L (ref 0–53)
AST: 24 U/L (ref 0–37)
Albumin: 4.5 g/dL (ref 3.5–5.2)
Alkaline Phosphatase: 39 U/L (ref 39–117)
BUN: 17 mg/dL (ref 6–23)
CO2: 33 mEq/L — ABNORMAL HIGH (ref 19–32)
Calcium: 11 mg/dL — ABNORMAL HIGH (ref 8.4–10.5)
Chloride: 98 mEq/L (ref 96–112)
Creatinine, Ser: 1.11 mg/dL (ref 0.40–1.50)
GFR: 65.96 mL/min (ref >60.00)
Glucose, Bld: 124 mg/dL — ABNORMAL HIGH (ref 70–99)
Potassium: 3.7 mEq/L (ref 3.5–5.1)
Sodium: 139 mEq/L (ref 135–145)
Total Bilirubin: 0.9 mg/dL (ref 0.2–1.2)
Total Protein: 7.2 g/dL (ref 6.0–8.3)

## 2021-01-23 LAB — LIPID PANEL
Cholesterol: 196 mg/dL (ref 0–200)
HDL: 55.3 mg/dL (ref >39.00)
LDL Cholesterol: 112 mg/dL — ABNORMAL HIGH (ref 0–99)
NonHDL: 140.28
Total CHOL/HDL Ratio: 4
Triglycerides: 142 mg/dL (ref 0.0–149.0)
VLDL: 28.4 mg/dL (ref 0.0–40.0)

## 2021-01-23 LAB — HEMOGLOBIN A1C: Hgb A1c MFr Bld: 5.9 % (ref 4.6–6.5)

## 2021-01-23 LAB — TSH: TSH: 1.2 u[IU]/mL (ref 0.35–4.50)

## 2021-01-23 LAB — PSA, MEDICARE: PSA: 0.84 ng/ml (ref 0.10–4.00)

## 2021-01-29 ENCOUNTER — Other Ambulatory Visit: Payer: Self-pay

## 2021-01-29 ENCOUNTER — Ambulatory Visit (INDEPENDENT_AMBULATORY_CARE_PROVIDER_SITE_OTHER): Payer: PPO

## 2021-01-29 DIAGNOSIS — Z0000 Encounter for general adult medical examination without abnormal findings: Secondary | ICD-10-CM | POA: Diagnosis not present

## 2021-01-29 NOTE — Progress Notes (Signed)
Subjective:   Raymond Kirby is a 75 y.o. male who presents for Medicare Annual/Subsequent preventive examination.  Review of Systems: N/A      I connected with the patient today by telephone and verified that I am speaking with the correct person using two identifiers. Location patient: home Location nurse: work Persons participating in the telephone visit: patient, nurse.   I discussed the limitations, risks, security and privacy concerns of performing an evaluation and management service by telephone and the availability of in person appointments. I also discussed with the patient that there may be a patient responsible charge related to this service. The patient expressed understanding and verbally consented to this telephonic visit.        Cardiac Risk Factors include: advanced age (>88men, >13 women);male gender;hypertension;Other (see comment), Risk factor comments: hyperlipidemia     Objective:    Today's Vitals   There is no height or weight on file to calculate BMI.  Advanced Directives 01/29/2021 04/16/2020 03/19/2020 11/28/2019 11/30/2016 10/14/2016  Does Patient Have a Medical Advance Directive? Yes Yes Yes Yes No Yes  Type of Paramedic of [place];Living will Living will;Healthcare Power of [place];Living will [place];Living will - [place];Living will  Does patient want to make changes to medical advance directive? - No - Patient declined No - Guardian declined - - -  Copy of [place] in Chart? No - copy requested - No - copy requested No - copy requested - No - copy requested    Current Medications (verified) Outpatient Encounter Medications as of 01/29/2021  Medication Sig  . atorvastatin (LIPITOR) 80 MG tablet Take 1 tablet (80 mg total) by mouth daily.  . bimatoprost (LUMIGAN) 0.03 % ophthalmic solution Place 1 drop into the right eye at bedtime.  .  brimonidine (ALPHAGAN) 0.2 % ophthalmic solution   . finasteride (PROSCAR) 5 MG tablet Take 1 tablet (5 mg total) by mouth daily.  Marland Kitchen HYDROcodone-homatropine (HYCODAN) 5-1.5 MG/5ML syrup Take 5 mLs by mouth every 8 (eight) hours as needed for cough.  . losartan-hydrochlorothiazide (HYZAAR) 100-12.5 MG tablet Take 1 tablet by mouth daily.  Marland Kitchen omeprazole (PRILOSEC) 20 MG capsule Take 1 capsule (20 mg total) by mouth daily.  . psyllium (METAMUCIL) 58.6 % packet Take 1 packet by mouth daily. 2 tablespoons daily  . tadalafil (CIALIS) 5 MG tablet Take 0.5-1 tablets (2.5-5 mg total) by mouth daily.   No facility-administered encounter medications on file as of 01/29/2021.    Allergies (verified) Lisinopril   History: Past Medical History:  Diagnosis Date  . BPH (benign prostatic hypertrophy) 11/16/1994  . Cataract    removed bilat   . Diverticulosis   . GERD (gastroesophageal reflux disease)   . Glaucoma   . Hemorrhoids   . Hyperlipemia 04/17/1995  . Hypertension 11/16/1994   Past Surgical History:  Procedure Laterality Date  . CATARACT EXTRACTION W/PHACO Right 03/19/2020   Procedure: CATARACT EXTRACTION PHACO AND INTRAOCULAR LENS PLACEMENT ([place]) RIGHT VIVITY LENS 10.47  00:57.9;  Surgeon: Birder Robson, MD;  Location: [facility];  Service: Ophthalmology;  Laterality: Right;  . CATARACT EXTRACTION W/PHACO Left 04/16/2020   Procedure: CATARACT EXTRACTION PHACO AND INTRAOCULAR LENS PLACEMENT ([place]) LEFT VIVITY TORIC LENS;  Surgeon: Birder Robson, MD;  Location: [facility];  Service: Ophthalmology;  Laterality: Left;  5.36 0:32.3  . COLONOSCOPY    . POLYPECTOMY     Family History  Problem  Relation Age of Onset  . Cancer Mother        breast CA Mets, recurrent  . Breast cancer Mother   . Hypertension Father   . Cancer Father        prostate cancer  . Heart disease Father        MI,CABG  . Aneurysm Father   . Prostate cancer Father   . Stroke Father   . Cancer  Sister        breast ca  . Breast cancer Sister   . Cancer Sister        breast cancer  . Breast cancer Sister   . Colon cancer Neg Hx   . Colon polyps Neg Hx   . Esophageal cancer Neg Hx   . Rectal cancer Neg Hx   . Stomach cancer Neg Hx    Social History   Socioeconomic History  . Marital status: Married    Spouse name: Not on file  . Number of children: 2  . Years of education: Not on file  . Highest education level: Not on file  Occupational History  . Occupation: Photographer  Tobacco Use  . Smoking status: Never Smoker  . Smokeless tobacco: Never Used  Vaping Use  . Vaping Use: Never used  Substance and Sexual Activity  . Alcohol use: Yes    Alcohol/week: 2.0 standard drinks    Types: 2 Shots of liquor per week    Comment: 2 drinks a day  . Drug use: No  . Sexual activity: Yes  Other Topics Concern  . Not on file  Social History Narrative   Married 40+ years   Photographer    2 kids, 5 grandkids.     [employer] grad   Social Determinants of Health   Financial Resource Strain: Low Risk   . Difficulty of Paying Living Expenses: Not hard at all  Food Insecurity: No Food Insecurity  . Worried About Charity fundraiser in the Last Year: Never true  . Ran Out of Food in the Last Year: Never true  Transportation Needs: No Transportation Needs  . Lack of Transportation (Medical): No  . Lack of Transportation (Non-Medical): No  Physical Activity: Insufficiently Active  . Days of Exercise per Week: 3 days  . Minutes of Exercise per Session: 20 min  Stress: No Stress Concern Present  . Feeling of Stress : Not at all  Social Connections: Not on file    Tobacco Counseling Counseling given: Not Answered   Clinical Intake:  Pre-visit preparation completed: Yes  Pain : No/denies pain     Nutritional Risks: None Diabetes: No  How often do you need to have someone help you when you read instructions, pamphlets, or other written materials from your doctor  or pharmacy?: 1 - Never  Diabetic: No Nutrition Risk Assessment:  Has the patient had any N/V/D within the last 2 months?  No  Does the patient have any non-healing wounds?  No  Has the patient had any unintentional weight loss or weight gain?  No   Diabetes:  Is the patient diabetic?  No  If diabetic, was a CBG obtained today?  N/A Did the patient bring in their glucometer from home?  N/A How often do you monitor your CBG's? N/A.   Financial Strains and Diabetes Management:  Are you having any financial strains with the device, your supplies or your medication? N/A.  Does the patient want to be seen by Chronic Care Management  for management of their diabetes?  N/A Would the patient like to be referred to a Nutritionist or for Diabetic Management?  N/A    Interpreter Needed?: No  Information entered by :: CJohnson, LPN   Activities of Daily Living In your present state of health, do you have any difficulty performing the following activities: 01/29/2021 03/19/2020  Hearing? N N  Vision? N N  Difficulty concentrating or making decisions? N N  Walking or climbing stairs? N N  Dressing or bathing? N N  Doing errands, shopping? N -  Preparing Food and eating ? N -  Using the Toilet? N -  In the past six months, have you accidently leaked urine? N -  Do you have problems with loss of bowel control? N -  Managing your Medications? N -  Managing your Finances? N -  Housekeeping or managing your Housekeeping? N -  Some recent data might be hidden    Patient Care Team: Tonia Ghent, MD as PCP - General (Family Medicine)  Indicate any recent Medical Services you may have received from other than [facility] providers in the past year (date may be approximate).     Assessment:   This is a routine wellness examination for [place].  Hearing/Vision screen  Hearing Screening   125Hz  250Hz  500Hz  1000Hz  2000Hz  3000Hz  4000Hz  6000Hz  8000Hz   Right ear:           Left ear:            Vision Screening Comments: Patient gets regular eye exams.   Dietary issues and exercise activities discussed: Current Exercise Habits: The patient does not participate in regular exercise at present;Home exercise routine, Type of exercise: walking, Time (Minutes): 20, Frequency (Times/Week): 3, Weekly Exercise (Minutes/Week): 60, Intensity: Moderate, Exercise limited by: None identified  Goals    . Increase water intake     Starting 10/14/2016, I will attempt to drink at least 8 oz water with each meal daily.     . Patient Stated     11/28/2019, I will maintain and continue medications as prescribed.     . Patient Stated     01/29/2021, I will continue to walk 3-4 days for about 20 minutes      Depression Screen PHQ 2/9 Scores 01/29/2021 10/28/2020 11/28/2019 11/22/2018 11/03/2017 10/14/2016 10/14/2015  PHQ - 2 Score 0 0 0 0 0 0 0  PHQ- 9 Score 0 - 0 - - - -    Fall Risk Fall Risk  01/29/2021 10/28/2020 11/28/2019 11/22/2018 11/03/2017  Falls in the past year? 0 0 0 0 No  Number falls in past yr: 0 0 0 - -  Injury with Fall? 0 0 0 - -  Risk for fall due to : Medication side effect - Medication side effect - -  Follow up Falls evaluation completed;Falls prevention discussed Falls evaluation completed Falls evaluation completed;Falls prevention discussed - -    FALL RISK PREVENTION PERTAINING TO THE HOME:  Any stairs in or around the home? Yes  If so, are there any without handrails? No  Home free of loose throw rugs in walkways, pet beds, electrical cords, etc? Yes  Adequate lighting in your home to reduce risk of falls? Yes   ASSISTIVE DEVICES UTILIZED TO PREVENT FALLS:  Life alert? No  Use of a cane, walker or w/c? No  Grab bars in the bathroom? No  Shower chair or bench in shower? No  Elevated toilet seat or a handicapped toilet? No  TIMED UP AND GO:  Was the test performed? N/A telephone visit .   Cognitive Function: MMSE - Mini Mental State Exam 01/29/2021 11/28/2019  10/14/2016  Not completed: Refused - -  Orientation to time - 5 5  Orientation to Place - 5 5  Registration - 3 3  Attention/ Calculation - 5 0  Recall - 3 3  Language- name 2 objects - - 0  Language- repeat - 1 1  Language- follow 3 step command - - 3  Language- read & follow direction - - 0  Write a sentence - - 0  Copy design - - 0  Total score - - 20  Mini Cog  Mini-Cog screen was not completed. Patient wanted to skip this. Maximum score is 22. A value of 0 denotes this part of the MMSE was not completed or the patient failed this part of the Mini-Cog screening.       Immunizations Immunization History  Administered Date(s) Administered  . Influenza Whole 10/23/2009  . Influenza,inj,Quad PF,6+ Mos 08/18/2013, 10/01/2014, 10/14/2015, 10/14/2016, 08/04/2018  . Influenza-Unspecified 08/30/2017, 09/09/2019, 08/15/2020  . PFIZER(Purple Top)SARS-COV-2 Vaccination 12/21/2019, 01/11/2020, 10/07/2020  . Pneumococcal Conjugate-13 10/01/2014  . Pneumococcal Polysaccharide-23 08/18/2013  . Td 02/15/1995, 11/11/2005  . Tdap 09/15/2017  . Zoster 04/02/2008    TDAP status: Up to date  Flu Vaccine status: Up to date  Pneumococcal vaccine status: Up to date  Covid-19 vaccine status: Completed vaccines  Qualifies for Shingles Vaccine? Yes   Zostavax completed Yes   Shingrix Completed?: No.    Education has been provided regarding the importance of this vaccine. Patient has been advised to call insurance company to determine out of pocket expense if they have not yet received this vaccine. Advised may also receive vaccine at local pharmacy or Health Dept. Verbalized acceptance and understanding.  Screening Tests Health Maintenance  Topic Date Due  . TETANUS/TDAP  09/16/2027  . INFLUENZA VACCINE  Completed  . COVID-19 Vaccine  Completed  . Hepatitis C Screening  Completed  . PNA vac Low Risk Adult  Completed  . HPV VACCINES  Aged Out    Health Maintenance  There are no  preventive care reminders to display for this patient.  Colorectal cancer screening: Type of screening: Colonoscopy. Completed 12/12/2020. no longer required   Lung Cancer Screening: (Low Dose CT Chest recommended if Age 87-80 years, 30 pack-year currently smoking OR have quit w/in 15 years.) does not qualify.    Additional Screening:  Hepatitis C Screening: does qualify; Completed 10/14/2016  Vision Screening: Recommended annual ophthalmology exams for early detection of glaucoma and other disorders of the eye. Is the patient up to date with their annual eye exam?  Yes  Who is the provider or what is the name of the office in which the patient attends annual eye exams? [facility]  If pt is not established with a provider, would they like to be referred to a provider to establish care? No .   Dental Screening: Recommended annual dental exams for proper oral hygiene  Community Resource Referral / Chronic Care Management: CRR required this visit?  No   CCM required this visit?  No      Plan:     I have personally reviewed and noted the following in the patient's chart:   . Medical and social history . Use of alcohol, tobacco or illicit drugs  . Current medications and supplements . Functional ability and status . Nutritional status . Physical activity .  Advanced directives . List of other physicians . Hospitalizations, surgeries, and ER visits in previous 12 months . Vitals . Screenings to include cognitive, depression, and falls . Referrals and appointments  In addition, I have reviewed and discussed with patient certain preventive protocols, quality metrics, and best practice recommendations. A written personalized care plan for preventive services as well as general preventive health recommendations were provided to patient.   Due to this being a telephonic visit, the after visit summary with patients personalized plan was offered to patient via office or  my-chart. Patient preferred to pick up at office at next visit or via mychart.   Andrez Grime, LPN   05/05/3558

## 2021-01-29 NOTE — Patient Instructions (Signed)
Raymond Kirby , Thank you for taking time to come for your Medicare Wellness Visit. I appreciate your ongoing commitment to your health goals. Please review the following plan we discussed and let me know if I can assist you in the future.   Screening recommendations/referrals: Colonoscopy: Up to date, completed 12/12/2020, no longer required  Recommended yearly ophthalmology/optometry visit for glaucoma screening and checkup Recommended yearly dental visit for hygiene and checkup  Vaccinations: Influenza vaccine: Up to date, completed 07/2021, due 06/2021 Pneumococcal vaccine: Completed series Tdap vaccine: Up to date, completed 09/15/2017, due 08/2027 Shingles vaccine: due, check with your insurance regarding coverage if interested    Covid-19: Completed series  Advanced directives: Please bring a copy of your POA (Power of [place]) and/or Living Will to your next appointment.   Conditions/risks identified: hypertension, hyperlipidemia   Next appointment: Follow up in one year for your annual wellness visit.   Preventive Care 60 Years and Older, Male Preventive care refers to lifestyle choices and visits with your health care provider that can promote health and wellness. What does preventive care include?  A yearly physical exam. This is also called an annual well check.  Dental exams once or twice a year.  Routine eye exams. Ask your health care provider how often you should have your eyes checked.  Personal lifestyle choices, including:  Daily care of your teeth and gums.  Regular physical activity.  Eating a healthy diet.  Avoiding tobacco and drug use.  Limiting alcohol use.  Practicing safe sex.  Taking low doses of aspirin every day.  Taking vitamin and mineral supplements as recommended by your health care provider. What happens during an annual well check? The services and screenings done by your health care provider during your annual well check will depend on  your age, overall health, lifestyle risk factors, and family history of disease. Counseling  Your health care provider may ask you questions about your:  Alcohol use.  Tobacco use.  Drug use.  Emotional well-being.  Home and relationship well-being.  Sexual activity.  Eating habits.  History of falls.  Memory and ability to understand (cognition).  Work and work Statistician. Screening  You may have the following tests or measurements:  Height, weight, and BMI.  Blood pressure.  Lipid and cholesterol levels. These may be checked every 5 years, or more frequently if you are over 93 years old.  Skin check.  Lung cancer screening. You may have this screening every year starting at age 55 if you have a 30-pack-year history of smoking and currently smoke or have quit within the past 15 years.  Fecal occult blood test (FOBT) of the stool. You may have this test every year starting at age 70.  Flexible sigmoidoscopy or colonoscopy. You may have a sigmoidoscopy every 5 years or a colonoscopy every 10 years starting at age 102.  Prostate cancer screening. Recommendations will vary depending on your family history and other risks.  Hepatitis C blood test.  Hepatitis B blood test.  Sexually transmitted disease (STD) testing.  Diabetes screening. This is done by checking your blood sugar (glucose) after you have not eaten for a while (fasting). You may have this done every 1-3 years.  Abdominal aortic aneurysm (AAA) screening. You may need this if you are a current or former smoker.  Osteoporosis. You may be screened starting at age 5 if you are at high risk. Talk with your health care provider about your test results, treatment options, and if  necessary, the need for more tests. Vaccines  Your health care provider may recommend certain vaccines, such as:  Influenza vaccine. This is recommended every year.  Tetanus, diphtheria, and acellular pertussis (Tdap, Td) vaccine.  You may need a Td booster every 10 years.  Zoster vaccine. You may need this after age 30.  Pneumococcal 13-valent conjugate (PCV13) vaccine. One dose is recommended after age 3.  Pneumococcal polysaccharide (PPSV23) vaccine. One dose is recommended after age 89. Talk to your health care provider about which screenings and vaccines you need and how often you need them. This information is not intended to replace advice given to you by your health care provider. Make sure you discuss any questions you have with your health care provider. Document Released: 11/29/2015 Document Revised: 07/22/2016 Document Reviewed: 09/03/2015 Elsevier Interactive Patient Education  2017 [place] Prevention in the Home Falls can cause injuries. They can happen to people of all ages. There are many things you can do to make your home safe and to help prevent falls. What can I do on the outside of my home?  Regularly fix the edges of walkways and driveways and fix any cracks.  Remove anything that might make you trip as you walk through a door, such as a raised step or threshold.  Trim any bushes or trees on the path to your home.  Use bright outdoor lighting.  Clear any walking paths of anything that might make someone trip, such as rocks or tools.  Regularly check to see if handrails are loose or broken. Make sure that both sides of any steps have handrails.  Any raised decks and porches should have guardrails on the edges.  Have any leaves, snow, or ice cleared regularly.  Use sand or salt on walking paths during winter.  Clean up any spills in your garage right away. This includes oil or grease spills. What can I do in the bathroom?  Use night lights.  Install grab bars by the toilet and in the tub and shower. Do not use towel bars as grab bars.  Use non-skid mats or decals in the tub or shower.  If you need to sit down in the shower, use a plastic, non-slip stool.  Keep the  floor dry. Clean up any water that spills on the floor as soon as it happens.  Remove soap buildup in the tub or shower regularly.  Attach bath mats securely with double-sided non-slip rug tape.  Do not have throw rugs and other things on the floor that can make you trip. What can I do in the bedroom?  Use night lights.  Make sure that you have a light by your bed that is easy to reach.  Do not use any sheets or blankets that are too big for your bed. They should not hang down onto the floor.  Have a firm chair that has side arms. You can use this for support while you get dressed.  Do not have throw rugs and other things on the floor that can make you trip. What can I do in the kitchen?  Clean up any spills right away.  Avoid walking on wet floors.  Keep items that you use a lot in easy-to-reach places.  If you need to reach something above you, use a strong step stool that has a grab bar.  Keep electrical cords out of the way.  Do not use floor polish or wax that makes floors slippery. If you must  use wax, use non-skid floor wax.  Do not have throw rugs and other things on the floor that can make you trip. What can I do with my stairs?  Do not leave any items on the stairs.  Make sure that there are handrails on both sides of the stairs and use them. Fix handrails that are broken or loose. Make sure that handrails are as long as the stairways.  Check any carpeting to make sure that it is firmly attached to the stairs. Fix any carpet that is loose or worn.  Avoid having throw rugs at the top or bottom of the stairs. If you do have throw rugs, attach them to the floor with carpet tape.  Make sure that you have a light switch at the top of the stairs and the bottom of the stairs. If you do not have them, ask someone to add them for you. What else can I do to help prevent falls?  Wear shoes that:  Do not have high heels.  Have rubber bottoms.  Are comfortable and fit  you well.  Are closed at the toe. Do not wear sandals.  If you use a stepladder:  Make sure that it is fully opened. Do not climb a closed stepladder.  Make sure that both sides of the stepladder are locked into place.  Ask someone to hold it for you, if possible.  Clearly mark and make sure that you can see:  Any grab bars or handrails.  First and last steps.  Where the edge of each step is.  Use tools that help you move around (mobility aids) if they are needed. These include:  Canes.  Walkers.  Scooters.  Crutches.  Turn on the lights when you go into a dark area. Replace any light bulbs as soon as they burn out.  Set up your furniture so you have a clear path. Avoid moving your furniture around.  If any of your floors are uneven, fix them.  If there are any pets around you, be aware of where they are.  Review your medicines with your doctor. Some medicines can make you feel dizzy. This can increase your chance of falling. Ask your doctor what other things that you can do to help prevent falls. This information is not intended to replace advice given to you by your health care provider. Make sure you discuss any questions you have with your health care provider. Document Released: 08/29/2009 Document Revised: 04/09/2016 Document Reviewed: 12/07/2014 Elsevier Interactive Patient Education  2017 [employer].

## 2021-01-29 NOTE — Progress Notes (Signed)
PCP notes:  Health Maintenance: No gaps noted   Abnormal Screenings: none   Patient concerns: none   Nurse concerns: none   Next PCP appt.: 01/30/2021 @ 2 pm

## 2021-01-30 ENCOUNTER — Encounter: Payer: Self-pay | Admitting: Family Medicine

## 2021-01-30 ENCOUNTER — Other Ambulatory Visit: Payer: Self-pay

## 2021-01-30 ENCOUNTER — Other Ambulatory Visit: Payer: Self-pay | Admitting: Family Medicine

## 2021-01-30 ENCOUNTER — Ambulatory Visit (INDEPENDENT_AMBULATORY_CARE_PROVIDER_SITE_OTHER): Payer: PPO | Admitting: Family Medicine

## 2021-01-30 VITALS — BP 120/78 | HR 56 | Temp 97.5°F | Ht 70.0 in | Wt 204.0 lb

## 2021-01-30 DIAGNOSIS — E785 Hyperlipidemia, unspecified: Secondary | ICD-10-CM

## 2021-01-30 DIAGNOSIS — I10 Essential (primary) hypertension: Secondary | ICD-10-CM

## 2021-01-30 DIAGNOSIS — Z0000 Encounter for general adult medical examination without abnormal findings: Secondary | ICD-10-CM

## 2021-01-30 DIAGNOSIS — Z8042 Family history of malignant neoplasm of prostate: Secondary | ICD-10-CM

## 2021-01-30 DIAGNOSIS — Z7189 Other specified counseling: Secondary | ICD-10-CM

## 2021-01-30 DIAGNOSIS — N529 Male erectile dysfunction, unspecified: Secondary | ICD-10-CM

## 2021-01-30 MED ORDER — ATORVASTATIN CALCIUM 80 MG PO TABS: 80.0000 mg | ORAL_TABLET | Freq: Every day | ORAL | 3 refills | Status: DC

## 2021-01-30 MED ORDER — LOSARTAN POTASSIUM-HCTZ 100-12.5 MG PO TABS: 1.0000 | ORAL_TABLET | Freq: Every day | ORAL | 3 refills | Status: DC

## 2021-01-30 MED ORDER — FINASTERIDE 5 MG PO TABS: 5.0000 mg | ORAL_TABLET | Freq: Every day | ORAL | 3 refills | Status: DC

## 2021-01-30 MED ORDER — OMEPRAZOLE 20 MG PO CPDR: 20.0000 mg | DELAYED_RELEASE_CAPSULE | Freq: Every day | ORAL | 3 refills | Status: DC

## 2021-01-30 NOTE — Patient Instructions (Signed)
Go to the lab on the way out.   If you have mychart we'll likely use that to update you.    °Take care.  Glad to see you. °Update me as needed.   °

## 2021-01-30 NOTE — Progress Notes (Signed)
This visit occurred during the SARS-CoV-2 public health emergency.  Safety protocols were in place, including screening questions prior to the visit, additional usage of staff PPE, and extensive cleaning of exam room while observing appropriate contact time as indicated for disinfecting solutions.  Elevated Cholesterol: Using medications without problems:yes Muscle aches: no Diet compliance: encouraged.   Exercise: walking for exercise.    Hypertension:    Using medication without problems or lightheadedness:  yes Chest pain with exertion: no Edema:no Short of breath:no  Calcium elevation d/w pt.  On HCTZ.  D/w pt about hypercalcemia work-up and parathyroid evaluation.  Parathyroid pathophysiology discussed with patient.  From hypercalcemia standpoint he is asymptomatic.  Prn cialis use, taking 5mg  qd prn.  No adverse effect on medication.  Vaccines d/w pt.  D/w pt about shingrix.   Colonoscopy 2022  PSA still normal 2021.   Hep C screening - completed- neg d/w pt.  AAA screening prev neg.   Advance directive- wife designated if patient were incapacitated.  Meds, vitals, and allergies reviewed.   PMH and SH reviewed  ROS: Per HPI unless specifically indicated in ROS section   GEN: nad, alert and oriented HEENT: NCAT NECK: supple w/o LA CV: rrr. PULM: ctab, no inc wob ABD: soft, +bs EXT: no edema SKIN: Well-perfused.

## 2021-01-31 LAB — PTH, INTACT AND CALCIUM
Calcium: 11.4 mg/dL — ABNORMAL HIGH (ref 8.6–10.3)
PTH: 79 pg/mL — ABNORMAL HIGH (ref 16–77)

## 2021-02-02 ENCOUNTER — Other Ambulatory Visit: Payer: Self-pay | Admitting: Family Medicine

## 2021-02-02 DIAGNOSIS — E213 Hyperparathyroidism, unspecified: Secondary | ICD-10-CM

## 2021-02-02 NOTE — Assessment & Plan Note (Signed)
Advance directive- wife designated if patient were incapacitated.  

## 2021-02-02 NOTE — Assessment & Plan Note (Signed)
Continue atorvastatin.  Continue work on diet and exercise.  Labs discussed with patient.

## 2021-02-02 NOTE — Assessment & Plan Note (Signed)
Continue losartan hydrochlorothiazide.  Continue work on diet and exercise.  Labs discussed with patient.

## 2021-02-02 NOTE — Assessment & Plan Note (Signed)
Calcium elevation d/w pt.  On HCTZ.  D/w pt about hypercalcemia work-up and parathyroid evaluation.  Parathyroid pathophysiology discussed with patient.  From hypercalcemia standpoint he is asymptomatic.  See notes on follow-up labs.

## 2021-02-02 NOTE — Assessment & Plan Note (Signed)
Vaccines d/w pt.  D/w pt about shingrix.   Colonoscopy 2022  PSA still normal 2021.   Hep C screening - completed- neg d/w pt.  AAA screening prev neg.   Advance directive- wife designated if patient were incapacitated.

## 2021-02-02 NOTE — Assessment & Plan Note (Signed)
Continue as needed Cialis.

## 2021-02-02 NOTE — Assessment & Plan Note (Signed)
PSA not elevated.  Continue finasteride.

## 2021-02-11 ENCOUNTER — Other Ambulatory Visit: Payer: Self-pay

## 2021-02-11 ENCOUNTER — Encounter: Payer: Self-pay | Admitting: General Surgery

## 2021-02-11 ENCOUNTER — Ambulatory Visit (INDEPENDENT_AMBULATORY_CARE_PROVIDER_SITE_OTHER): Payer: PPO | Admitting: General Surgery

## 2021-02-11 VITALS — BP 159/94 | HR 77 | Temp 98.5°F | Ht 71.0 in | Wt 201.0 lb

## 2021-02-11 DIAGNOSIS — E210 Primary hyperparathyroidism: Secondary | ICD-10-CM

## 2021-02-11 MED ORDER — LOSARTAN POTASSIUM 100 MG PO TABS: 100.0000 mg | ORAL_TABLET | Freq: Every day | ORAL | 0 refills | Status: DC

## 2021-02-11 NOTE — Progress Notes (Signed)
Patient ID: Raymond Kirby, male   DOB: 1946/01/07, 74 y.o.   MRN: [id number]  Chief Complaint  Patient presents with  . New Patient (Initial Visit)    Hyperparathyroidism     HPI Raymond Kirby is a 75 y.o. male.  He has been referred by his primary care provider, Dr. Damita Dunnings, for further evaluation of hypercalcemia and possible primary hyperparathyroidism.  For the past several years, Raymond Kirby calcium level has been near the upper limit of normal, occasionally drifting just above 10.3, which is our upper limit of normal.  More recently, however, he has had persistent elevations of his calcium to 11, and subsequently 11.4.  A PTH level concomitantly drawn with the 11.4 value was elevated at 79 pg/dL.  As a result, Raymond Kirby has been referred for further evaluation and management of presumed hyperparathyroidism.  He denies any history of nephrolithiasis or pathologic fracture.  No long bone pain.  He does have gastroesophageal reflux disease and takes a daily proton pump inhibitor.  He denies any history of peptic ulcer disease or pancreatitis.  He denies constipation.  No polydipsia or polyuria.  He does have roughly 2 episodes of nocturia on a regular basis, but attributes this to his enlarged prostate.  He denies any issues with memory or foggy thinking.  No sleep disturbances.  He does admit to being somewhat irritable, which his wife confirms.  He does take hydrochlorothiazide and lab values were obtained while he was on this medication.  He denies use of Tums, Rolaids, or exogenous calcium supplementation.  He has never had a bone density study done.  He denies any family history of hypercalcemia, nephrolithiasis, parathyroid, pituitary, pancreas, or adrenal tumors.  No other history of hereditary endocrinopathies or jaw tumor syndrome.  No significant radiation exposure.   Past Medical History:  Diagnosis Date  . BPH (benign prostatic hypertrophy) 11/16/1994  . Cataract    removed bilat   .  Diverticulosis   . GERD (gastroesophageal reflux disease)   . Glaucoma   . Hemorrhoids   . Hyperlipemia 04/17/1995  . Hypertension 11/16/1994    Past Surgical History:  Procedure Laterality Date  . CATARACT EXTRACTION W/PHACO Right 03/19/2020   Procedure: CATARACT EXTRACTION PHACO AND INTRAOCULAR LENS PLACEMENT ([place]) RIGHT VIVITY LENS 10.47  00:57.9;  Surgeon: Birder Robson, MD;  Location: [place];  Service: Ophthalmology;  Laterality: Right;  . CATARACT EXTRACTION W/PHACO Left 04/16/2020   Procedure: CATARACT EXTRACTION PHACO AND INTRAOCULAR LENS PLACEMENT ([place]) LEFT VIVITY TORIC LENS;  Surgeon: Birder Robson, MD;  Location: [place];  Service: Ophthalmology;  Laterality: Left;  5.36 0:32.3  . COLONOSCOPY    . POLYPECTOMY      Family History  Problem Relation Age of Onset  . Cancer Mother        breast CA Mets, recurrent  . Breast cancer Mother   . Hypertension Father   . Cancer Father        prostate cancer  . Heart disease Father        MI,CABG  . Aneurysm Father   . Prostate cancer Father   . Stroke Father   . Cancer Sister        breast ca  . Breast cancer Sister   . Cancer Sister        breast cancer  . Breast cancer Sister   . Colon cancer Neg Hx   . Colon polyps Neg Hx   . Esophageal cancer Neg Hx   .  Rectal cancer Neg Hx   . Stomach cancer Neg Hx     Social History Social History   Tobacco Use  . Smoking status: Never Smoker  . Smokeless tobacco: Never Used  Vaping Use  . Vaping Use: Never used  Substance Use Topics  . Alcohol use: Yes    Alcohol/week: 2.0 standard drinks    Types: 2 Shots of liquor per week    Comment: 2 drinks a day  . Drug use: No    Allergies  Allergen Reactions  . Lisinopril Other (See Comments)    cough    Current Outpatient Medications  Medication Sig Dispense Refill  . atorvastatin (LIPITOR) 80 MG tablet Take 1 tablet (80 mg total) by mouth daily. 90 tablet 3  . bimatoprost (LUMIGAN)  0.03 % ophthalmic solution Place 1 drop into the right eye at bedtime.    . brimonidine (ALPHAGAN) 0.2 % ophthalmic solution     . finasteride (PROSCAR) 5 MG tablet Take 1 tablet (5 mg total) by mouth daily. 90 tablet 3  . losartan (COZAAR) 100 MG tablet Take 1 tablet (100 mg total) by mouth daily. Take one daily in place of your Hyzaar. 10 tablet 0  . losartan-hydrochlorothiazide (HYZAAR) 100-12.5 MG tablet Take 1 tablet by mouth daily. 90 tablet 3  . omeprazole (PRILOSEC) 20 MG capsule Take 1 capsule (20 mg total) by mouth daily. 90 capsule 3  . psyllium (METAMUCIL) 58.6 % packet Take 1 packet by mouth daily. 2 tablespoons daily    . tadalafil (CIALIS) 5 MG tablet Take 0.5-1 tablets (2.5-5 mg total) by mouth daily.     No current facility-administered medications for this visit.    Review of Systems Review of Systems  All other systems reviewed and are negative. Or as discussed in history of present illness.  Blood pressure (!) 159/94, pulse 77, temperature 98.5 F (36.9 C), height 5\' 11"  (1.803 m), weight 201 lb (91.2 kg), SpO2 99 %. Body mass index is 28.03 kg/m.  Physical Exam Physical Exam Vitals reviewed.  Constitutional:      General: He is not in acute distress.    Appearance: Normal appearance.  HENT:     Head: Normocephalic and atraumatic.     Nose:     Comments: Covered with a mask    Mouth/Throat:     Comments: Covered with a mask Eyes:     General: No scleral icterus.       Right eye: No discharge.        Left eye: No discharge.  Neck:     Comments: The trachea is midline.  There is no palpable cervical or supraclavicular lymphadenopathy.  The thyroid is slightly enlarged, right greater than left, but no discrete nodules were palpated.  The gland moves freely with deglutition. Cardiovascular:     Rate and Rhythm: Normal rate and regular rhythm.     Pulses: Normal pulses.  Pulmonary:     Effort: Pulmonary effort is normal.     Breath sounds: Normal breath  sounds.  Abdominal:     General: Bowel sounds are normal.     Palpations: Abdomen is soft.  Genitourinary:    Comments: Deferred Musculoskeletal:        General: No swelling or tenderness.  Skin:    General: Skin is warm and dry.  Neurological:     General: No focal deficit present.     Mental Status: He is alert and oriented to person, place, and time.  Psychiatric:  Mood and Affect: Mood normal.        Behavior: Behavior normal.     Data Reviewed Results for KYIAN, OBST (MRN [id number]) as of 02/11/2021 12:01  Ref. Range 05/10/2012 09:33 08/10/2013 10:24 02/02/2014 11:43 09/28/2014 09:20 10/02/2015 10:54 10/14/2016 10:37 10/28/2017 09:37 11/14/2018 10:17 11/28/2019 10:07 01/23/2021 09:26 01/30/2021 14:45  Calcium Latest Ref Range: 8.6 - 10.3 mg/dL 9.9 9.6 10.3 10.2 10.5 10.4 9.9 10.6 (H) 10.5 11.0 (H) 11.4 (H)  Creatinine Latest Ref Range: 0.40 - 1.50 mg/dL 1.0 1.0 1.0 1.0 0.97 0.96 1.02 1.04 1.02 1.11   GFR Latest Ref Range: >60.00 mL/min 81.68 83.32 79.37 76.56 81.80 82.53 76.72 74.80 71.76 65.96   Alkaline Phosphatase Latest Ref Range: 39 - 117 U/L 57 58 57 62 55 57 53 54 46 39    PTH, Intact Latest Ref Range: 16 - 77 pg/mL           79 (H)   These labs show progressive increase in his calcium level, including 1 that is greater than 1 mg/dL above our labs upper limit of normal (11.4).  There is a concomitant elevation of his intact PTH.  Assessment This is a 75 year old man with hypercalcemia and likely primary hyperparathyroidism.  He has been taking hydrochlorothiazide which can elevate serum calcium levels.  In addition, we need to confirm that he has adequate vitamin D and that there are no other potential explanations for his hypercalcemia, such as benign familial hypocalciuric hypercalcemia.  Plan I have asked him to stop taking his Hyzaar for 7 days.  In the interim, I have prescribed plain losartan, without the hydrochlorothiazide component.  After those 7 days, he  should report to the lab to have some blood work done.  I have ordered a basic metabolic panel, magnesium, phosphorus, 25-hydroxy vitamin D, serum protein electrophoresis, intact PTH, and a random urine creatinine/random urine calcium to calculate his fractional excretion of calcium.  Although he is otherwise asymptomatic, he does meet criteria for surgery given the elevation in his calcium greater than 1 mg/dL above the upper limit of normal.  I discussed in some detail with both the patient and his wife what the function of the parathyroid glands is and the effects of long-term untreated primary hyperparathyroidism.  I also reviewed the operation with them in some detail.  They had the opportunity to ask questions and these were answered to their satisfaction.  Once I have the results of these additional labs, I will contact the patient and discuss any further plans including localizing imaging and possible surgery.    Fredirick Maudlin 02/11/2021, 11:59 AM

## 2021-02-11 NOTE — Patient Instructions (Addendum)
We will want you to stop taking your Hyzaar for 7 days before you have your lab work done. On day 8 go to [facility], the [facility] entrance.   While you are off of your Hyzaar you will take the Losartan 100 mg once daily in its place.  After you have your lab work you may go back to taking the Hyzaar once daily and stop the Losartan.   BMP, Magnisium, phosphorus, PTH, 25 Hydroxy Vit D, spot urine Calcium and Creatinine, Spep(protein electrophoresis).   We will call you with your results and speak with you about the next steps.     Hypercalcemia Hypercalcemia is when the level of calcium in a person's blood is above normal. The body needs calcium to make bones and keep them strong. Calcium also helps the muscles, nerves, brain, and heart work the way they should. Most of the calcium in the body is in the bones. There is also some calcium in the blood. Hypercalcemia can happen when calcium comes out of the bones, or when the kidneys are not able to remove calcium from the blood. Hypercalcemia can be mild or severe. What are the causes? There are many possible causes of hypercalcemia. Common causes of this condition include:  Hyperparathyroidism. This is a condition in which the body produces too much parathyroid hormone. There are four parathyroid glands in your neck. These glands produce a chemical messenger (hormone) that helps the body absorb calcium from foods and helps your bones release calcium.  Certain kinds of cancer. Less common causes of hypercalcemia include:  Getting too much calcium or vitamin D from your diet.  Kidney failure.  Hyperthyroidism.  Severe dehydration.  Being on bed rest or being inactive for a long time.  Certain medicines.  Infections. What increases the risk? You are more likely to develop this condition if you:  Are male.  Are 42 years of age or older.  Have a family history of hypercalcemia. What are the signs or symptoms? Mild hypercalcemia  that starts slowly may not cause symptoms. Severe, sudden hypercalcemia is more likely to cause symptoms, such as:  Being more thirsty than usual.  Needing to urinate more often than usual.  Abdominal pain.  Nausea and vomiting.  Constipation.  Muscle pain, twitching, or weakness.  Feeling very tired. How is this diagnosed? Hypercalcemia is usually diagnosed with a blood test. You may also have tests to help determine what is causing this condition, such as imaging tests and more blood tests.   How is this treated? Treatment for hypercalcemia depends on the cause. Treatment may include:  Receiving fluids through an IV.  Medicines that: ? Keep calcium levels steady after receiving fluids (loop diuretics). ? Keep calcium in your bones (bisphosphonates). ? Lower the calcium level in your blood.  Surgery to remove overactive parathyroid glands.  A procedure that filters your blood to correct calcium levels (hemodialysis). Follow these instructions at home:  Take over-the-counter and prescription medicines only as told by your health care provider.  Follow instructions from your health care provider about eating or drinking restrictions.  Drink enough fluid to keep your urine pale yellow.  Stay active. Weight-bearing exercise helps to keep calcium in your bones. Follow instructions from your health care provider about what type and level of exercise is safe for you.  Keep all follow-up visits as told by your health care provider. This is important.   Contact a health care provider if you have:  A fever.  A heartbeat that is irregular or very fast.  Changes in mood, memory, or personality. Get help right away if you:  Have severe abdominal pain.  Have chest pain.  Have trouble breathing.  Become very confused and sleepy.  Lose consciousness. Summary  Hypercalcemia is when the level of calcium in a person's blood is above normal. The body needs calcium to make  bones and keep them strong. Calcium also helps the muscles, nerves, brain, and heart work the way they should.  There are many possible causes of hypercalcemia, and treatment depends on the cause.  Take over-the-counter and prescription medicines only as told by your health care provider.  Follow instructions from your health care provider about eating or drinking restrictions. This information is not intended to replace advice given to you by your health care provider. Make sure you discuss any questions you have with your health care provider. Document Revised: 11/29/2018 Document Reviewed: 08/08/2018 Elsevier Patient Education  2021 [employer].

## 2021-02-19 ENCOUNTER — Other Ambulatory Visit
Admission: RE | Admit: 2021-02-19 | Discharge: 2021-02-19 | Disposition: A | Discharge status: Home / Self Care | Payer: PPO | Attending: General Surgery | Admitting: General Surgery

## 2021-02-19 ENCOUNTER — Other Ambulatory Visit: Payer: Self-pay

## 2021-02-19 DIAGNOSIS — E210 Primary hyperparathyroidism: Secondary | ICD-10-CM | POA: Insufficient documentation

## 2021-02-19 LAB — BASIC METABOLIC PANEL
Anion gap: 7 (ref 5–15)
BUN: 13 mg/dL (ref 8–23)
CO2: 26 mmol/L (ref 22–32)
Calcium: 10.2 mg/dL (ref 8.9–10.3)
Chloride: 107 mmol/L (ref 98–111)
Creatinine, Ser: 1.05 mg/dL (ref 0.61–1.24)
GFR, Estimated: >60 mL/min (ref >60)
Glucose, Bld: 111 mg/dL — ABNORMAL HIGH (ref 70–99)
Potassium: 4 mmol/L (ref 3.5–5.1)
Sodium: 140 mmol/L (ref 135–145)

## 2021-02-19 LAB — PHOSPHORUS: Phosphorus: 2.4 mg/dL — ABNORMAL LOW (ref 2.5–4.6)

## 2021-02-19 LAB — VITAMIN D 25 HYDROXY (VIT D DEFICIENCY, FRACTURES): Vit D, 25-Hydroxy: 16.83 ng/mL — ABNORMAL LOW (ref 30–100)

## 2021-02-19 LAB — MAGNESIUM: Magnesium: 1.8 mg/dL (ref 1.7–2.4)

## 2021-02-19 LAB — CREATININE, URINE, RANDOM: Creatinine, Urine: 168 mg/dL

## 2021-02-20 LAB — PARATHYROID HORMONE, INTACT (NO CA): PTH: 63 pg/mL (ref 15–65)

## 2021-02-20 LAB — CALCIUM, URINE, RANDOM: Calcium, Ur: 13.7 mg/dL

## 2021-02-21 ENCOUNTER — Telehealth: Payer: Self-pay | Admitting: General Surgery

## 2021-02-21 LAB — PROTEIN ELECTROPHORESIS, SERUM
A/G Ratio: 1.2 (ref 0.7–1.7)
Albumin ELP: 3.8 g/dL (ref 2.9–4.4)
Alpha-1-Globulin: 0.3 g/dL (ref 0.0–0.4)
Alpha-2-Globulin: 0.8 g/dL (ref 0.4–1.0)
Beta Globulin: 1.2 g/dL (ref 0.7–1.3)
Gamma Globulin: 1.1 g/dL (ref 0.4–1.8)
Globulin, Total: 3.3 g/dL (ref 2.2–3.9)
Total Protein ELP: 7.1 g/dL (ref 6.0–8.5)

## 2021-02-21 MED ORDER — VITAMIN D (ERGOCALCIFEROL) 1.25 MG (50000 UNIT) PO CAPS: 50000.0000 [IU] | ORAL_CAPSULE | ORAL | 3 refills | Status: DC

## 2021-02-21 NOTE — Telephone Encounter (Signed)
Spoke with patient and relayed lab results. Off of HCTZ, his calcium dropped back into the normal range.  PTH remains higher than appropriate for calcium, however vitamin D level is markedly low.  Will prescribe ergocalciferol 50, 000 units weekly for 3 months.  Will also send message to Dr. Damita Dunnings regarding these results and request assistance with rechecking labs at that time, as well as consideration of using a different BP med without thiazide component.

## 2021-02-23 ENCOUNTER — Other Ambulatory Visit: Payer: Self-pay | Admitting: Family Medicine

## 2021-02-23 DIAGNOSIS — E210 Primary hyperparathyroidism: Secondary | ICD-10-CM

## 2021-02-23 MED ORDER — LOSARTAN POTASSIUM 100 MG PO TABS: 100.0000 mg | ORAL_TABLET | Freq: Every day | ORAL | 3 refills | Status: DC

## 2021-06-06 DIAGNOSIS — H401112 Primary open-angle glaucoma, right eye, moderate stage: Secondary | ICD-10-CM | POA: Diagnosis not present

## 2021-06-06 DIAGNOSIS — Z961 Presence of intraocular lens: Secondary | ICD-10-CM | POA: Diagnosis not present

## 2021-09-04 ENCOUNTER — Encounter: Payer: Self-pay | Admitting: General Surgery

## 2021-11-13 ENCOUNTER — Other Ambulatory Visit: Payer: Self-pay | Admitting: Family Medicine

## 2021-11-13 DIAGNOSIS — Z125 Encounter for screening for malignant neoplasm of prostate: Secondary | ICD-10-CM

## 2021-11-13 DIAGNOSIS — R739 Hyperglycemia, unspecified: Secondary | ICD-10-CM

## 2021-11-13 DIAGNOSIS — Z8042 Family history of malignant neoplasm of prostate: Secondary | ICD-10-CM

## 2021-11-13 DIAGNOSIS — I10 Essential (primary) hypertension: Secondary | ICD-10-CM

## 2021-11-13 NOTE — Telephone Encounter (Signed)
Last office visit 01/30/2021 for Medicare Wellness.  Last refilled 02/21/2021 for #5 with 3 refills.  Last Vit D level low at 16/83 ng/ml on 02/19/2021.  No future appointments.  Refill?

## 2021-11-14 NOTE — Telephone Encounter (Signed)
Needs f/u labs prior to refill.  Please help him arrange lab visit, possibly at [facility] station. Thanks.

## 2021-11-14 NOTE — Telephone Encounter (Signed)
Spoke with patient and he wanted to wait to have drawn with his CPE labs in march. I went ahead and got him scheduled for his lab appt and AWV march 2023. Can you go ahead and enter labs for him?

## 2021-11-17 NOTE — Telephone Encounter (Signed)
Done. Thanks.

## 2021-11-17 NOTE — Addendum Note (Signed)
Addended by: Tonia Ghent on: 11/17/2021 12:12 PM   Modules accepted: Orders

## 2021-12-10 DIAGNOSIS — H401112 Primary open-angle glaucoma, right eye, moderate stage: Secondary | ICD-10-CM | POA: Diagnosis not present

## 2022-01-27 ENCOUNTER — Other Ambulatory Visit: Payer: Self-pay

## 2022-01-27 ENCOUNTER — Other Ambulatory Visit (INDEPENDENT_AMBULATORY_CARE_PROVIDER_SITE_OTHER): Payer: PPO

## 2022-01-27 DIAGNOSIS — Z8042 Family history of malignant neoplasm of prostate: Secondary | ICD-10-CM

## 2022-01-27 DIAGNOSIS — Z125 Encounter for screening for malignant neoplasm of prostate: Secondary | ICD-10-CM | POA: Diagnosis not present

## 2022-01-27 DIAGNOSIS — R739 Hyperglycemia, unspecified: Secondary | ICD-10-CM | POA: Diagnosis not present

## 2022-01-27 DIAGNOSIS — I10 Essential (primary) hypertension: Secondary | ICD-10-CM | POA: Diagnosis not present

## 2022-01-27 DIAGNOSIS — E210 Primary hyperparathyroidism: Secondary | ICD-10-CM | POA: Diagnosis not present

## 2022-01-27 LAB — COMPREHENSIVE METABOLIC PANEL
ALT: 29 U/L (ref 0–53)
AST: 19 U/L (ref 0–37)
Albumin: 4.5 g/dL (ref 3.5–5.2)
Alkaline Phosphatase: 47 U/L (ref 39–117)
BUN: 12 mg/dL (ref 6–23)
CO2: 31 mEq/L (ref 19–32)
Calcium: 10.9 mg/dL — ABNORMAL HIGH (ref 8.4–10.5)
Chloride: 103 mEq/L (ref 96–112)
Creatinine, Ser: 1.09 mg/dL (ref 0.40–1.50)
GFR: 66.94 mL/min (ref >60.00)
Glucose, Bld: 121 mg/dL — ABNORMAL HIGH (ref 70–99)
Potassium: 4.7 mEq/L (ref 3.5–5.1)
Sodium: 140 mEq/L (ref 135–145)
Total Bilirubin: 0.5 mg/dL (ref 0.2–1.2)
Total Protein: 6.6 g/dL (ref 6.0–8.3)

## 2022-01-27 LAB — LIPID PANEL
Cholesterol: 199 mg/dL (ref 0–200)
HDL: 57.2 mg/dL (ref >39.00)
LDL Cholesterol: 109 mg/dL — ABNORMAL HIGH (ref 0–99)
NonHDL: 141.53
Total CHOL/HDL Ratio: 3
Triglycerides: 165 mg/dL — ABNORMAL HIGH (ref 0.0–149.0)
VLDL: 33 mg/dL (ref 0.0–40.0)

## 2022-01-27 LAB — VITAMIN D 25 HYDROXY (VIT D DEFICIENCY, FRACTURES): VITD: 34.52 ng/mL (ref 30.00–100.00)

## 2022-01-27 LAB — PSA, MEDICARE: PSA: 0.9 ng/ml (ref 0.10–4.00)

## 2022-01-27 LAB — TSH: TSH: 2.01 u[IU]/mL (ref 0.35–5.50)

## 2022-01-27 LAB — HEMOGLOBIN A1C: Hgb A1c MFr Bld: 6.3 % (ref 4.6–6.5)

## 2022-01-29 LAB — PTH, INTACT AND CALCIUM: PTH: 50 pg/mL (ref 16–77)

## 2022-02-02 ENCOUNTER — Other Ambulatory Visit: Payer: Self-pay | Admitting: Family Medicine

## 2022-02-02 ENCOUNTER — Encounter: Payer: Self-pay | Admitting: Family Medicine

## 2022-02-02 ENCOUNTER — Ambulatory Visit (INDEPENDENT_AMBULATORY_CARE_PROVIDER_SITE_OTHER): Payer: PPO | Admitting: Family Medicine

## 2022-02-02 ENCOUNTER — Other Ambulatory Visit: Payer: Self-pay

## 2022-02-02 VITALS — BP 122/76 | HR 70 | Temp 98.4°F | Ht 71.0 in | Wt 208.0 lb

## 2022-02-02 DIAGNOSIS — N400 Enlarged prostate without lower urinary tract symptoms: Secondary | ICD-10-CM | POA: Diagnosis not present

## 2022-02-02 DIAGNOSIS — E785 Hyperlipidemia, unspecified: Secondary | ICD-10-CM | POA: Diagnosis not present

## 2022-02-02 DIAGNOSIS — Z0000 Encounter for general adult medical examination without abnormal findings: Secondary | ICD-10-CM

## 2022-02-02 DIAGNOSIS — Z7189 Other specified counseling: Secondary | ICD-10-CM

## 2022-02-02 DIAGNOSIS — I10 Essential (primary) hypertension: Secondary | ICD-10-CM

## 2022-02-02 DIAGNOSIS — E210 Primary hyperparathyroidism: Secondary | ICD-10-CM

## 2022-02-02 MED ORDER — VITAMIN D3 50 MCG (2000 UT) PO CAPS: 2000.0000 [IU] | ORAL_CAPSULE | Freq: Every day | ORAL | Status: AC

## 2022-02-02 MED ORDER — OMEPRAZOLE 20 MG PO CPDR: 20.0000 mg | DELAYED_RELEASE_CAPSULE | Freq: Every day | ORAL | 3 refills | Status: DC

## 2022-02-02 MED ORDER — ATORVASTATIN CALCIUM 80 MG PO TABS: 80.0000 mg | ORAL_TABLET | Freq: Every day | ORAL | 3 refills | Status: DC

## 2022-02-02 MED ORDER — FINASTERIDE 5 MG PO TABS: 5.0000 mg | ORAL_TABLET | Freq: Every day | ORAL | 3 refills | Status: DC

## 2022-02-02 MED ORDER — LOSARTAN POTASSIUM 100 MG PO TABS: 100.0000 mg | ORAL_TABLET | Freq: Every day | ORAL | 3 refills | Status: DC

## 2022-02-02 NOTE — Patient Instructions (Addendum)
Keep taking vit D 2000 units a day.   ?Recheck labs in about 3 months.  Nonfasting labs.   ?Take care.  Glad to see you. ?

## 2022-02-02 NOTE — Progress Notes (Signed)
This visit occurred during the SARS-CoV-2 public health emergency.  Safety protocols were in place, including screening questions prior to the visit, additional usage of staff PPE, and extensive cleaning of exam room while observing appropriate contact time as indicated for disinfecting solutions. ? ?I have personally reviewed the Medicare Annual Wellness questionnaire and have noted ?1. The patient's medical and social history ?2. Their use of alcohol, tobacco or illicit drugs ?3. Their current medications and supplements ?4. The patient's functional ability including ADL's, fall risks, home safety risks and hearing or visual ?            impairment. ?5. Diet and physical activities ?6. Evidence for depression or mood disorders ? ?The patients weight, height, BMI have been recorded in the chart and visual acuity is per eye clinic.  ?I have made referrals, counseling and provided education to the patient based review of the above and I have provided the pt with a written personalized care plan for preventive services. ? ?Provider list updated- see scanned forms.  Routine anticipatory guidance given to patient.  See health maintenance. The possibility exists that previously documented standard health maintenance information may have been brought forward from a previous encounter into this note.  If needed, that same information has been updated to reflect the current situation based on today's encounter.   ? ?Flu previously done ?shingles discussed with patient ?PNA up-to-date ?Tetanus 2018 ?COVID-vaccine previously done ?Colonoscopy 2022 ?Prostate cancer screening 2023 ?Advance directive-wife designated if patient were incapacitated. ?Cognitive function addressed- see scanned forms- and if abnormal then additional documentation follows.  ? ?In addition to [facility] Wellness, follow up visit for the below conditions: ? ?BP was prev ~140/90 last year when checked out of clinic, normal today.  ? ?Higher calcium d/w pt.   PTH still wnl.  Labs d/w pt.  Off HCTZ.  Vit D wnl.  Given his situation with his wife's illness, he understandably wanted to defer intervention now.  D/w pt about recheck labs in about 3 months.  No renal stones.   ? ?BPH discussed with patient.  Finasteride use d/w pt.  Some nocturia.  PSA wnl, d/w pt about correction.   He is able to tolerate nocturia as is. ? ?Elevated Cholesterol: ?Using medications without problems: yes ?Muscle aches: no ?Diet compliance: d/w pt.  ?Exercise: d/w pt.   ?Labs d/w pt.   ? ?Hypertension:    ?Using medication without problems or lightheadedness:  yes ?Chest pain with exertion:no ?Edema: no ?Short of breath:no ? ?Wife had a CVA, d/w pt.  She is clearly better but not back to 100% pre-event level.   ? ?PMH and SH reviewed ? ?Meds, vitals, and allergies reviewed.  ? ?ROS: Per HPI.  Unless specifically indicated otherwise in HPI, the patient denies: ? ?General: fever. ?Eyes: acute vision changes ?ENT: sore throat ?Cardiovascular: chest pain ?Respiratory: SOB ?GI: vomiting ?GU: dysuria ?Musculoskeletal: acute back pain ?Derm: acute rash ?Neuro: acute motor dysfunction ?Psych: worsening mood ?Endocrine: polydipsia ?Heme: bleeding ?Allergy: hayfever ? ?GEN: nad, alert and oriented ?HEENT: ncat ?NECK: supple w/o LA ?CV: rrr. ?PULM: ctab, no inc wob ?ABD: soft, +bs ?EXT: no edema ?SKIN: Well-perfused. ?

## 2022-02-04 NOTE — Assessment & Plan Note (Signed)
BPH discussed with patient.  Finasteride use d/w pt.  Some nocturia.  PSA wnl, d/w pt about correction.   He is able to tolerate nocturia as is.  Continue finasteride. ?

## 2022-02-04 NOTE — Assessment & Plan Note (Signed)
Continue atorvastatin.  Continue work on diet and exercise. 

## 2022-02-04 NOTE — Assessment & Plan Note (Signed)
Continue losartan.  Continue work on diet and exercise. ?

## 2022-02-04 NOTE — Assessment & Plan Note (Signed)
Flu previously done ?shingles discussed with patient ?PNA up-to-date ?Tetanus 2018 ?COVID-vaccine previously done ?Colonoscopy 2022 ?Prostate cancer screening 2023 ?Advance directive-wife designated if patient were incapacitated. ?Cognitive function addressed- see scanned forms- and if abnormal then additional documentation follows.  ?

## 2022-02-04 NOTE — Assessment & Plan Note (Signed)
Advance directive- wife designated if patient were incapacitated.  

## 2022-02-04 NOTE — Assessment & Plan Note (Signed)
Higher calcium d/w pt.  PTH still wnl.  Labs d/w pt.  Off HCTZ.  Vit D wnl.  Given his situation with his wife's illness, he understandably wanted to defer intervention now.  D/w pt about recheck labs in about 3 months.  No renal stones.  He wanted to defer surgery referral at this point. ?

## 2022-05-25 DIAGNOSIS — H43812 Vitreous degeneration, left eye: Secondary | ICD-10-CM | POA: Diagnosis not present

## 2022-07-13 DIAGNOSIS — H401112 Primary open-angle glaucoma, right eye, moderate stage: Secondary | ICD-10-CM | POA: Diagnosis not present

## 2022-11-18 ENCOUNTER — Other Ambulatory Visit: Payer: Self-pay | Admitting: Family Medicine

## 2022-11-19 NOTE — Telephone Encounter (Signed)
Patient is due for medicare wellness in march; please call to schedule appt

## 2022-11-19 NOTE — Telephone Encounter (Signed)
Patient scheduled.

## 2022-12-28 ENCOUNTER — Other Ambulatory Visit: Payer: Self-pay | Admitting: Family Medicine

## 2022-12-28 DIAGNOSIS — E210 Primary hyperparathyroidism: Secondary | ICD-10-CM

## 2023-01-07 ENCOUNTER — Telehealth: Payer: Self-pay | Admitting: Family Medicine

## 2023-01-07 NOTE — Telephone Encounter (Signed)
[facility] to schedule their annual wellness visit. Appointment made for 02/04/2023.  [place] Direct Dial: [phone]

## 2023-01-13 DIAGNOSIS — H401112 Primary open-angle glaucoma, right eye, moderate stage: Secondary | ICD-10-CM | POA: Diagnosis not present

## 2023-01-13 DIAGNOSIS — Z961 Presence of intraocular lens: Secondary | ICD-10-CM | POA: Diagnosis not present

## 2023-01-13 DIAGNOSIS — H43813 Vitreous degeneration, bilateral: Secondary | ICD-10-CM | POA: Diagnosis not present

## 2023-01-23 ENCOUNTER — Other Ambulatory Visit: Payer: Self-pay | Admitting: Family Medicine

## 2023-01-23 DIAGNOSIS — Z125 Encounter for screening for malignant neoplasm of prostate: Secondary | ICD-10-CM

## 2023-01-23 DIAGNOSIS — E785 Hyperlipidemia, unspecified: Secondary | ICD-10-CM

## 2023-01-23 DIAGNOSIS — R739 Hyperglycemia, unspecified: Secondary | ICD-10-CM

## 2023-01-23 DIAGNOSIS — I10 Essential (primary) hypertension: Secondary | ICD-10-CM

## 2023-01-26 ENCOUNTER — Other Ambulatory Visit: Payer: Self-pay | Admitting: Family Medicine

## 2023-01-27 NOTE — Telephone Encounter (Signed)
Patient needs medicare wellness scheduled. Please call patient to set one up.

## 2023-01-27 NOTE — Telephone Encounter (Signed)
Patient already scheduled

## 2023-01-29 ENCOUNTER — Other Ambulatory Visit (INDEPENDENT_AMBULATORY_CARE_PROVIDER_SITE_OTHER): Payer: PPO

## 2023-01-29 DIAGNOSIS — R739 Hyperglycemia, unspecified: Secondary | ICD-10-CM

## 2023-01-29 DIAGNOSIS — I10 Essential (primary) hypertension: Secondary | ICD-10-CM | POA: Diagnosis not present

## 2023-01-29 DIAGNOSIS — Z125 Encounter for screening for malignant neoplasm of prostate: Secondary | ICD-10-CM

## 2023-01-29 DIAGNOSIS — E785 Hyperlipidemia, unspecified: Secondary | ICD-10-CM | POA: Diagnosis not present

## 2023-01-29 LAB — VITAMIN D 25 HYDROXY (VIT D DEFICIENCY, FRACTURES): VITD: 33.64 ng/mL (ref 30.00–100.00)

## 2023-01-29 LAB — COMPREHENSIVE METABOLIC PANEL
ALT: 29 U/L (ref 0–53)
AST: 20 U/L (ref 0–37)
Albumin: 4.4 g/dL (ref 3.5–5.2)
Alkaline Phosphatase: 56 U/L (ref 39–117)
BUN: 14 mg/dL (ref 6–23)
CO2: 30 mEq/L (ref 19–32)
Calcium: 10.8 mg/dL — ABNORMAL HIGH (ref 8.4–10.5)
Chloride: 103 mEq/L (ref 96–112)
Creatinine, Ser: 1.07 mg/dL (ref 0.40–1.50)
GFR: 67.96 mL/min (ref >60.00)
Glucose, Bld: 122 mg/dL — ABNORMAL HIGH (ref 70–99)
Potassium: 4.1 mEq/L (ref 3.5–5.1)
Sodium: 140 mEq/L (ref 135–145)
Total Bilirubin: 0.5 mg/dL (ref 0.2–1.2)
Total Protein: 7.2 g/dL (ref 6.0–8.3)

## 2023-01-29 LAB — LIPID PANEL
Cholesterol: 190 mg/dL (ref 0–200)
HDL: 56.9 mg/dL (ref >39.00)
LDL Cholesterol: 114 mg/dL — ABNORMAL HIGH (ref 0–99)
NonHDL: 133.21
Total CHOL/HDL Ratio: 3
Triglycerides: 94 mg/dL (ref 0.0–149.0)
VLDL: 18.8 mg/dL (ref 0.0–40.0)

## 2023-01-29 LAB — HEMOGLOBIN A1C: Hgb A1c MFr Bld: 6.3 % (ref 4.6–6.5)

## 2023-01-29 LAB — PSA, MEDICARE: PSA: 0.64 ng/ml (ref 0.10–4.00)

## 2023-01-29 LAB — TSH: TSH: 1.73 u[IU]/mL (ref 0.35–5.50)

## 2023-01-30 LAB — PTH, INTACT AND CALCIUM
Calcium: 10.5 mg/dL — ABNORMAL HIGH (ref 8.6–10.3)
PTH: 104 pg/mL — ABNORMAL HIGH (ref 16–77)

## 2023-02-02 ENCOUNTER — Other Ambulatory Visit: Payer: Self-pay | Admitting: Family Medicine

## 2023-02-04 ENCOUNTER — Ambulatory Visit (INDEPENDENT_AMBULATORY_CARE_PROVIDER_SITE_OTHER): Payer: PPO

## 2023-02-04 VITALS — Wt 208.0 lb

## 2023-02-04 DIAGNOSIS — Z0000 Encounter for general adult medical examination without abnormal findings: Secondary | ICD-10-CM | POA: Diagnosis not present

## 2023-02-04 NOTE — Progress Notes (Signed)
I connected with  Kayvan Laskey Nixon on 02/04/23 by a audio enabled telemedicine application and verified that I am speaking with the correct person using two identifiers.  Patient Location: Home  Provider Location: Office/Clinic  I discussed the limitations of evaluation and management by telemedicine. The patient expressed understanding and agreed to proceed.   Subjective:   Raymond Kirby is a 77 y.o. male who presents for Medicare Annual/Subsequent preventive examination.  Review of Systems     Cardiac Risk Factors include: advanced age (>74men, >31 women);male gender;hypertension;dyslipidemia     Objective:    Today's Vitals   02/04/23 1013  Weight: 208 lb (94.3 kg)   Body mass index is 29.01 kg/m.     02/04/2023   10:17 AM 01/29/2021    3:29 PM 04/16/2020   12:21 PM 03/19/2020    8:03 AM 11/28/2019    2:43 PM 11/30/2016    1:17 PM 10/14/2016   10:19 AM  Advanced Directives  Does Patient Have a Medical Advance Directive? Yes Yes Yes Yes Yes No Yes  Type of Paramedic of [place];Living will [place];Living will Living will;Healthcare Power of [place];Living will [place];Living will  [place];Living will  Does patient want to make changes to medical advance directive?   No - Patient declined No - Guardian declined     Copy of [place] in Chart? No - copy requested No - copy requested  No - copy requested No - copy requested  No - copy requested    Current Medications (verified) Outpatient Encounter Medications as of 02/04/2023  Medication Sig   atorvastatin (LIPITOR) 80 MG tablet TAKE 1 TABLET BY MOUTH DAILY.   bimatoprost (LUMIGAN) 0.03 % ophthalmic solution Place 1 drop into the right eye at bedtime.   brimonidine (ALPHAGAN) 0.2 % ophthalmic solution    Cholecalciferol (VITAMIN D3) 50 MCG (2000 UT) capsule Take 1 capsule (2,000 Units total) by  mouth daily.   finasteride (PROSCAR) 5 MG tablet TAKE 1 TABLET BY MOUTH DAILY.   losartan (COZAAR) 100 MG tablet TAKE 1 TABLET BY MOUTH DAILY.   omeprazole (PRILOSEC) 20 MG capsule TAKE 1 CAPSULE BY MOUTH DAILY.   psyllium (METAMUCIL) 58.6 % packet Take 1 packet by mouth daily. 2 tablespoons daily   No facility-administered encounter medications on file as of 02/04/2023.    Allergies (verified) Lisinopril   History: Past Medical History:  Diagnosis Date   BPH (benign prostatic hypertrophy) 11/16/1994   Cataract    removed bilat    Diverticulosis    GERD (gastroesophageal reflux disease)    Glaucoma    Hemorrhoids    Hyperlipemia 04/17/1995   Hypertension 11/16/1994   Past Surgical History:  Procedure Laterality Date   CATARACT EXTRACTION W/PHACO Right 03/19/2020   Procedure: CATARACT EXTRACTION PHACO AND INTRAOCULAR LENS PLACEMENT ([place]) RIGHT VIVITY LENS 10.47  00:57.9;  Surgeon: Birder Robson, MD;  Location: [facility];  Service: Ophthalmology;  Laterality: Right;   CATARACT EXTRACTION W/PHACO Left 04/16/2020   Procedure: CATARACT EXTRACTION PHACO AND INTRAOCULAR LENS PLACEMENT ([place]) LEFT VIVITY TORIC LENS;  Surgeon: Birder Robson, MD;  Location: [facility];  Service: Ophthalmology;  Laterality: Left;  5.36 0:32.3   COLONOSCOPY     POLYPECTOMY     Family History  Problem Relation Age of Onset   Cancer Mother        breast CA Mets, recurrent   Breast cancer  Mother    Hypertension Father    Cancer Father        prostate cancer   Heart disease Father        MI,CABG   Aneurysm Father    Prostate cancer Father    Stroke Father    Cancer Sister        breast ca   Breast cancer Sister    Cancer Sister        breast cancer   Breast cancer Sister    Colon cancer Neg Hx    Colon polyps Neg Hx    Esophageal cancer Neg Hx    Rectal cancer Neg Hx    Stomach cancer Neg Hx    Social History   Socioeconomic History   Marital status: Married     Spouse name: Not on file   Number of children: 2   Years of education: Not on file   Highest education level: Not on file  Occupational History   Occupation: Photographer  Tobacco Use   Smoking status: Never   Smokeless tobacco: Never  Vaping Use   Vaping Use: Never used  Substance and Sexual Activity   Alcohol use: Yes    Alcohol/week: 2.0 standard drinks of alcohol    Types: 2 Shots of liquor per week    Comment: 2 drinks a day   Drug use: No   Sexual activity: Yes  Other Topics Concern   Not on file  Social History Narrative   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     [employer] grad   Social Determinants of Health   Financial Resource Strain: Low Risk  (02/04/2023)   Overall Financial Resource Strain (CARDIA)    Difficulty of Paying Living Expenses: Not hard at all  Food Insecurity: No Food Insecurity (02/04/2023)   Hunger Vital Sign    Worried About Running Out of Food in the Last Year: Never true    Ran Out of Food in the Last Year: Never true  Transportation Needs: No Transportation Needs (02/04/2023)   PRAPARE - Hydrologist (Medical): No    Lack of Transportation (Non-Medical): No  Physical Activity: Insufficiently Active (02/04/2023)   Exercise Vital Sign    Days of Exercise per Week: 3 days    Minutes of Exercise per Session: 20 min  Stress: No Stress Concern Present (02/04/2023)   [place]    Feeling of Stress : Only a little  Social Connections: Socially Integrated (02/04/2023)   Social Connection and Isolation Panel [NHANES]    Frequency of Communication with Friends and Family: More than three times a week    Frequency of Social Gatherings with Friends and Family: More than three times a week    Attends Religious Services: More than 4 times per year    Active Member of [employer] or Organizations: Yes    Attends Archivist Meetings: 1 to 4  times per year    Marital Status: Married    Tobacco Counseling Counseling given: Not Answered   Clinical Intake:  Pre-visit preparation completed: Yes  Pain : No/denies pain     Nutritional Risks: None Diabetes: No  How often do you need to have someone help you when you read instructions, pamphlets, or other written materials from your doctor or pharmacy?: 1 - Never  Diabetic?no  Interpreter Needed?: No  Information entered by :: [employer]  Josha Weekley, LPN   Activities of Daily Living    02/04/2023   10:19 AM  In your present state of health, do you have any difficulty performing the following activities:  Hearing? 1  Comment slight loss  Vision? 0  Difficulty concentrating or making decisions? 0  Walking or climbing stairs? 0  Dressing or bathing? 0  Doing errands, shopping? 0  Preparing Food and eating ? N  Using the Toilet? N  In the past six months, have you accidently leaked urine? N  Do you have problems with loss of bowel control? N  Managing your Medications? N  Managing your Finances? N  Housekeeping or managing your Housekeeping? N    Patient Care Team: Tonia Ghent, MD as PCP - General (Family Medicine)  Indicate any recent Medical Services you may have received from other than [facility] providers in the past year (date may be approximate).     Assessment:   This is a routine wellness examination for [place].  Hearing/Vision screen Hearing Screening - Comments:: Pt stated slight loss  Vision Screening - Comments:: Pt follows up with [facility] for annual eye exams   Dietary issues and exercise activities discussed: Current Exercise Habits: Home exercise routine, Type of exercise: walking, Time (Minutes): 20, Frequency (Times/Week): 3, Weekly Exercise (Minutes/Week): 60   Goals Addressed             This Visit's Progress    Patient Stated       Lose weight        Depression Screen    02/04/2023   10:16 AM 02/02/2022   10:33  AM 01/29/2021    3:35 PM 10/28/2020    2:49 PM 11/28/2019    2:44 PM 11/22/2018   11:00 AM 11/03/2017    2:25 PM  PHQ 2/9 Scores  PHQ - 2 Score 0 0 0 0 0 0 0  PHQ- 9 Score   0  0      Fall Risk    02/04/2023   10:19 AM 02/02/2022   10:32 AM 01/29/2021    3:35 PM 10/28/2020    2:49 PM 11/28/2019    2:43 PM  Fall Risk   Falls in the past year? 0 0 0 0 0  Number falls in past yr: 0 0 0 0 0  Injury with Fall? 0 0 0 0 0  Risk for fall due to : Impaired vision History of fall(s) Medication side effect  Medication side effect  Follow up Falls prevention discussed Falls evaluation completed Falls evaluation completed;Falls prevention discussed Falls evaluation completed Falls evaluation completed;Falls prevention discussed    FALL RISK PREVENTION PERTAINING TO THE HOME:  Any stairs in or around the home? Yes  If so, are there any without handrails? No  Home free of loose throw rugs in walkways, pet beds, electrical cords, etc? Yes  Adequate lighting in your home to reduce risk of falls? Yes   ASSISTIVE DEVICES UTILIZED TO PREVENT FALLS:  Life alert? Apple phone  Use of a cane, walker or w/c? No  Grab bars in the bathroom? No  Shower chair or bench in shower? Yes  Elevated toilet seat or a handicapped toilet? No   TIMED UP AND GO:  Was the test performed? No .   Cognitive Function:pt decline see note below     01/29/2021    3:36 PM 11/28/2019    2:53 PM 10/14/2016    9:58 AM  MMSE - Mini Mental State Exam  Not completed: Refused    Orientation to time  5 5  Orientation to Place  5 5  Registration  3 3  Attention/ Calculation  5 0  Recall  3 3  Language- name 2 objects   0  Language- repeat  1 1  Language- follow 3 step command   3  Language- read & follow direction   0  Write a sentence   0  Copy design   0  Total score   20        Immunizations Immunization History  Administered Date(s) Administered   Influenza Whole 10/23/2009   Influenza,inj,Quad PF,6+ Mos  08/18/2013, 10/01/2014, 10/14/2015, 10/14/2016, 08/04/2018   Influenza-Unspecified 08/30/2017, 09/09/2019, 08/15/2020   PFIZER Comirnaty(Gray Top)Covid-19 Tri-Sucrose Vaccine 12/21/2019, 01/11/2020   PFIZER(Purple Top)SARS-COV-2 Vaccination 10/07/2020   Pneumococcal Conjugate-13 10/01/2014   Pneumococcal Polysaccharide-23 08/18/2013   Td 02/15/1995, 11/11/2005   Tdap 09/15/2017   Zoster, Live 04/02/2008    TDAP status: Up to date  Flu Vaccine status: Due, Education has been provided regarding the importance of this vaccine. Advised may receive this vaccine at local pharmacy or Health Dept. Aware to provide a copy of the vaccination record if obtained from local pharmacy or Health Dept. Verbalized acceptance and understanding.  Pneumococcal vaccine status: Up to date  Covid-19 vaccine status: Completed vaccines  Qualifies for Shingles Vaccine? Yes   Zostavax completed No   Shingrix Completed?: No.    Education has been provided regarding the importance of this vaccine. Patient has been advised to call insurance company to determine out of pocket expense if they have not yet received this vaccine. Advised may also receive vaccine at local pharmacy or Health Dept. Verbalized acceptance and understanding.  Screening Tests Health Maintenance  Topic Date Due   Zoster Vaccines- Shingrix (1 of 2) Never done   INFLUENZA VACCINE  06/16/2022   COVID-19 Vaccine (4 - 2023-24 season) 07/17/2022   Medicare Annual Wellness (AWV)  02/04/2024   DTaP/Tdap/Td (4 - Td or Tdap) 09/16/2027   Pneumonia Vaccine 76+ Years old  Completed   Hepatitis C Screening  Completed   HPV VACCINES  Aged Out   COLONOSCOPY (Pts 45-3yrs Insurance coverage will need to be confirmed)  Discontinued    Health Maintenance  Health Maintenance Due  Topic Date Due   Zoster Vaccines- Shingrix (1 of 2) Never done   INFLUENZA VACCINE  06/16/2022   COVID-19 Vaccine (4 - 2023-24 season) 07/17/2022    Colorectal cancer  screening: No longer required.   Additional Screening:  Hepatitis C Screening:  Completed 10/14/16  Vision Screening: Recommended annual ophthalmology exams for early detection of glaucoma and other disorders of the eye. Is the patient up to date with their annual eye exam?  Yes  Who is the provider or what is the name of the office in which the patient attends annual eye exams? [facility]  If pt is not established with a provider, would they like to be referred to a provider to establish care? No .   Dental Screening: Recommended annual dental exams for proper oral hygiene  Community Resource Referral / Chronic Care Management: CRR required this visit?  No   CCM required this visit?  No      Plan:     I have personally reviewed and noted the following in the patient's chart:   Medical and social history Use of alcohol, tobacco or illicit drugs  Current medications and supplements including opioid prescriptions. Patient is not currently  taking opioid prescriptions. Functional ability and status Nutritional status Physical activity Advanced directives List of other physicians Hospitalizations, surgeries, and ER visits in previous 12 months Vitals Screenings to include cognitive, depression, and falls Referrals and appointments  In addition, I have reviewed and discussed with patient certain preventive protocols, quality metrics, and best practice recommendations. A written personalized care plan for preventive services as well as general preventive health recommendations were provided to patient.     Willette Brace, LPN   [id number]   Nurse Notes: pt declined cognition test at this time pt was alert and able to answer questions appropriately

## 2023-02-04 NOTE — Patient Instructions (Signed)
[name] , Thank you for taking time to come for your Medicare Wellness Visit. I appreciate your ongoing commitment to your health goals. Please review the following plan we discussed and let me know if I can assist you in the future.   These are the goals we discussed:  Goals      Increase water intake     Starting 10/14/2016, I will attempt to drink at least 8 oz water with each meal daily.      Patient Stated     11/28/2019, I will maintain and continue medications as prescribed.      Patient Stated     01/29/2021, I will continue to walk 3-4 days for about 20 minutes     Patient Stated     Lose weight         This is a list of the screening recommended for you and due dates:  Health Maintenance  Topic Date Due   Zoster (Shingles) Vaccine (1 of 2) Never done   Flu Shot  06/16/2022   COVID-19 Vaccine (4 - 2023-24 season) 07/17/2022   Medicare Annual Wellness Visit  02/04/2024   DTaP/Tdap/Td vaccine (4 - Td or Tdap) 09/16/2027   Pneumonia Vaccine  Completed   Hepatitis C Screening: USPSTF Recommendation to screen - Ages 49-79 yo.  Completed   HPV Vaccine  Aged Out   Colon Cancer Screening  Discontinued    Advanced directives: Please bring a copy of your health care power of attorney and living will to the office at your convenience.  Conditions/risks identified: lose weight   Next appointment: Follow up in one year for your annual wellness visit.   Preventive Care 24 Years and Older, Male  Preventive care refers to lifestyle choices and visits with your health care provider that can promote health and wellness. What does preventive care include? A yearly physical exam. This is also called an annual well check. Dental exams once or twice a year. Routine eye exams. Ask your health care provider how often you should have your eyes checked. Personal lifestyle choices, including: Daily care of your teeth and gums. Regular physical activity. Eating a healthy  diet. Avoiding tobacco and drug use. Limiting alcohol use. Practicing safe sex. Taking low doses of aspirin every day. Taking vitamin and mineral supplements as recommended by your health care provider. What happens during an annual well check? The services and screenings done by your health care provider during your annual well check will depend on your age, overall health, lifestyle risk factors, and family history of disease. Counseling  Your health care provider may ask you questions about your: Alcohol use. Tobacco use. Drug use. Emotional well-being. Home and relationship well-being. Sexual activity. Eating habits. History of falls. Memory and ability to understand (cognition). Work and work Statistician. Screening  You may have the following tests or measurements: Height, weight, and BMI. Blood pressure. Lipid and cholesterol levels. These may be checked every 5 years, or more frequently if you are over 6 years old. Skin check. Lung cancer screening. You may have this screening every year starting at age 37 if you have a 30-pack-year history of smoking and currently smoke or have quit within the past 15 years. Fecal occult blood test (FOBT) of the stool. You may have this test every year starting at age 34. Flexible sigmoidoscopy or colonoscopy. You may have a sigmoidoscopy every 5 years or a colonoscopy every 10 years starting at age 50. Prostate cancer screening. Recommendations will  vary depending on your family history and other risks. Hepatitis C blood test. Hepatitis B blood test. Sexually transmitted disease (STD) testing. Diabetes screening. This is done by checking your blood sugar (glucose) after you have not eaten for a while (fasting). You may have this done every 1-3 years. Abdominal aortic aneurysm (AAA) screening. You may need this if you are a current or former smoker. Osteoporosis. You may be screened starting at age 46 if you are at high risk. Talk with  your health care provider about your test results, treatment options, and if necessary, the need for more tests. Vaccines  Your health care provider may recommend certain vaccines, such as: Influenza vaccine. This is recommended every year. Tetanus, diphtheria, and acellular pertussis (Tdap, Td) vaccine. You may need a Td booster every 10 years. Zoster vaccine. You may need this after age 75. Pneumococcal 13-valent conjugate (PCV13) vaccine. One dose is recommended after age 12. Pneumococcal polysaccharide (PPSV23) vaccine. One dose is recommended after age 35. Talk to your health care provider about which screenings and vaccines you need and how often you need them. This information is not intended to replace advice given to you by your health care provider. Make sure you discuss any questions you have with your health care provider. Document Released: 11/29/2015 Document Revised: 07/22/2016 Document Reviewed: 09/03/2015 Elsevier Interactive Patient Education  2017 [place] Prevention in the Home Falls can cause injuries. They can happen to people of all ages. There are many things you can do to make your home safe and to help prevent falls. What can I do on the outside of my home? Regularly fix the edges of walkways and driveways and fix any cracks. Remove anything that might make you trip as you walk through a door, such as a raised step or threshold. Trim any bushes or trees on the path to your home. Use bright outdoor lighting. Clear any walking paths of anything that might make someone trip, such as rocks or tools. Regularly check to see if handrails are loose or broken. Make sure that both sides of any steps have handrails. Any raised decks and porches should have guardrails on the edges. Have any leaves, snow, or ice cleared regularly. Use sand or salt on walking paths during winter. Clean up any spills in your garage right away. This includes oil or grease spills. What  can I do in the bathroom? Use night lights. Install grab bars by the toilet and in the tub and shower. Do not use towel bars as grab bars. Use non-skid mats or decals in the tub or shower. If you need to sit down in the shower, use a plastic, non-slip stool. Keep the floor dry. Clean up any water that spills on the floor as soon as it happens. Remove soap buildup in the tub or shower regularly. Attach bath mats securely with double-sided non-slip rug tape. Do not have throw rugs and other things on the floor that can make you trip. What can I do in the bedroom? Use night lights. Make sure that you have a light by your bed that is easy to reach. Do not use any sheets or blankets that are too big for your bed. They should not hang down onto the floor. Have a firm chair that has side arms. You can use this for support while you get dressed. Do not have throw rugs and other things on the floor that can make you trip. What can I do in  the kitchen? Clean up any spills right away. Avoid walking on wet floors. Keep items that you use a lot in easy-to-reach places. If you need to reach something above you, use a strong step stool that has a grab bar. Keep electrical cords out of the way. Do not use floor polish or wax that makes floors slippery. If you must use wax, use non-skid floor wax. Do not have throw rugs and other things on the floor that can make you trip. What can I do with my stairs? Do not leave any items on the stairs. Make sure that there are handrails on both sides of the stairs and use them. Fix handrails that are broken or loose. Make sure that handrails are as long as the stairways. Check any carpeting to make sure that it is firmly attached to the stairs. Fix any carpet that is loose or worn. Avoid having throw rugs at the top or bottom of the stairs. If you do have throw rugs, attach them to the floor with carpet tape. Make sure that you have a light switch at the top of the  stairs and the bottom of the stairs. If you do not have them, ask someone to add them for you. What else can I do to help prevent falls? Wear shoes that: Do not have high heels. Have rubber bottoms. Are comfortable and fit you well. Are closed at the toe. Do not wear sandals. If you use a stepladder: Make sure that it is fully opened. Do not climb a closed stepladder. Make sure that both sides of the stepladder are locked into place. Ask someone to hold it for you, if possible. Clearly mark and make sure that you can see: Any grab bars or handrails. First and last steps. Where the edge of each step is. Use tools that help you move around (mobility aids) if they are needed. These include: Canes. Walkers. Scooters. Crutches. Turn on the lights when you go into a dark area. Replace any light bulbs as soon as they burn out. Set up your furniture so you have a clear path. Avoid moving your furniture around. If any of your floors are uneven, fix them. If there are any pets around you, be aware of where they are. Review your medicines with your doctor. Some medicines can make you feel dizzy. This can increase your chance of falling. Ask your doctor what other things that you can do to help prevent falls. This information is not intended to replace advice given to you by your health care provider. Make sure you discuss any questions you have with your health care provider. Document Released: 08/29/2009 Document Revised: 04/09/2016 Document Reviewed: 12/07/2014 Elsevier Interactive Patient Education  2017 [employer].

## 2023-02-05 ENCOUNTER — Encounter: Payer: PPO | Admitting: Family Medicine

## 2023-02-08 ENCOUNTER — Encounter: Payer: Self-pay | Admitting: Family Medicine

## 2023-02-08 ENCOUNTER — Ambulatory Visit (INDEPENDENT_AMBULATORY_CARE_PROVIDER_SITE_OTHER): Payer: PPO | Admitting: Family Medicine

## 2023-02-08 DIAGNOSIS — Z7189 Other specified counseling: Secondary | ICD-10-CM

## 2023-02-08 DIAGNOSIS — E210 Primary hyperparathyroidism: Secondary | ICD-10-CM

## 2023-02-08 DIAGNOSIS — E785 Hyperlipidemia, unspecified: Secondary | ICD-10-CM

## 2023-02-08 DIAGNOSIS — I10 Essential (primary) hypertension: Secondary | ICD-10-CM

## 2023-02-08 DIAGNOSIS — N400 Enlarged prostate without lower urinary tract symptoms: Secondary | ICD-10-CM

## 2023-02-08 DIAGNOSIS — Z0000 Encounter for general adult medical examination without abnormal findings: Secondary | ICD-10-CM

## 2023-02-08 MED ORDER — FINASTERIDE 5 MG PO TABS: 5.0000 mg | ORAL_TABLET | Freq: Every day | ORAL | 3 refills | Status: DC

## 2023-02-08 MED ORDER — ATORVASTATIN CALCIUM 80 MG PO TABS: 80.0000 mg | ORAL_TABLET | Freq: Every day | ORAL | 3 refills | Status: DC

## 2023-02-08 MED ORDER — LOSARTAN POTASSIUM 100 MG PO TABS: 100.0000 mg | ORAL_TABLET | Freq: Every day | ORAL | 3 refills | Status: DC

## 2023-02-08 MED ORDER — OMEPRAZOLE 20 MG PO CPDR: 20.0000 mg | DELAYED_RELEASE_CAPSULE | Freq: Every day | ORAL | 3 refills | Status: DC

## 2023-02-08 MED ORDER — TAMSULOSIN HCL 0.4 MG PO CAPS: 0.4000 mg | ORAL_CAPSULE | Freq: Every day | ORAL | 3 refills | Status: DC

## 2023-02-08 NOTE — Patient Instructions (Addendum)
Urinary options- avoid decongestants.  Update me if flomax isn't helpful/tolerated.   Take care.  Glad to see you. Let me know if you don't hear about seeing Dr. Harlow Asa.

## 2023-02-08 NOTE — Progress Notes (Unsigned)
Hypertension:    Using medication without problems or lightheadedness: yes Chest pain with exertion:no Edema:no Short of breath:no Labs d/w pt.   Elevated Cholesterol: Using medications without problems: yes Muscle aches: no Diet compliance: d/w pt.  Exercise: d/w pt.    Slower stream at baseline, still on finasteride. PSA d/w pt, ie with correction 0.64---->1.28.  nocturia x2.  D/w pt about avoiding oral decongestants.  Discussed options re: flomax vs seeing urology.  Routine cautions for flomax, rx sent.    Vaccines d/w pt.  D/w pt about shingrix.   RSV vaccine d/w pt.   Colonoscopy 2022  PSA still normal 2024 Hep C screening - completed- neg d/w pt.   AAA screening prev neg.   Advance directive- wife designated if patient were incapacitated.   PTH elevation d/w pt.  Surgery referral d/w pt.    PMH and SH reviewed  Meds, vitals, and allergies reviewed.   ROS: Per HPI unless specifically indicated in ROS section   GEN: nad, alert and oriented HEENT: ncat NECK: supple w/o LA CV: rrr. PULM: ctab, no inc wob ABD: soft, +bs EXT: no edema SKIN: no acute rash

## 2023-02-10 NOTE — Assessment & Plan Note (Signed)
Vaccines d/w pt.  D/w pt about shingrix.   RSV vaccine d/w pt.   Colonoscopy 2022  PSA still normal 2024 Hep C screening - completed- neg d/w pt.   AAA screening prev neg.   Advance directive- wife designated if patient were incapacitated.

## 2023-02-10 NOTE — Assessment & Plan Note (Signed)
PTH elevation d/w pt.  Surgery referral d/w pt.   PTH elevation and pathophysiology discussed with patient.

## 2023-02-10 NOTE — Assessment & Plan Note (Signed)
Continue atorvastatin.  Labs discussed with patient.  Continue work on diet and exercise. 

## 2023-02-10 NOTE — Assessment & Plan Note (Signed)
Advance directive- wife designated if patient were incapacitated.  

## 2023-02-10 NOTE — Assessment & Plan Note (Signed)
Slower stream at baseline, still on finasteride. PSA d/w pt, ie with correction 0.64---->1.28.  nocturia x2.  D/w pt about avoiding oral decongestants.  Discussed options re: flomax vs seeing urology.  Routine cautions for flomax, rx sent.

## 2023-02-10 NOTE — Assessment & Plan Note (Signed)
Continue losartan.  Labs discussed with patient.  Continue work on diet and exercise.

## 2023-04-05 ENCOUNTER — Telehealth: Payer: Self-pay | Admitting: Family Medicine

## 2023-04-05 DIAGNOSIS — E210 Primary hyperparathyroidism: Secondary | ICD-10-CM

## 2023-04-05 NOTE — Telephone Encounter (Signed)
Patient called the office and requested referral for general surgery be re faxed to [facility]. If needed, please advise [phone].

## 2023-04-06 NOTE — Telephone Encounter (Signed)
Done. Thanks.

## 2023-05-13 ENCOUNTER — Other Ambulatory Visit: Payer: Self-pay | Admitting: Surgery

## 2023-05-13 DIAGNOSIS — E210 Primary hyperparathyroidism: Secondary | ICD-10-CM | POA: Diagnosis not present

## 2023-05-13 DIAGNOSIS — E213 Hyperparathyroidism, unspecified: Secondary | ICD-10-CM | POA: Diagnosis not present

## 2023-05-17 DIAGNOSIS — E210 Primary hyperparathyroidism: Secondary | ICD-10-CM | POA: Diagnosis not present

## 2023-05-25 ENCOUNTER — Encounter
Admission: RE | Admit: 2023-05-25 | Discharge: 2023-05-25 | Disposition: A | Discharge status: Home / Self Care | Payer: PPO | Source: Ambulatory Visit | Attending: Surgery | Admitting: Surgery

## 2023-05-25 ENCOUNTER — Ambulatory Visit
Admission: RE | Admit: 2023-05-25 | Discharge: 2023-05-25 | Disposition: A | Discharge status: Home / Self Care | Payer: PPO | Source: Ambulatory Visit | Attending: Surgery | Admitting: Surgery

## 2023-05-25 DIAGNOSIS — E042 Nontoxic multinodular goiter: Secondary | ICD-10-CM | POA: Diagnosis not present

## 2023-05-25 DIAGNOSIS — E210 Primary hyperparathyroidism: Secondary | ICD-10-CM | POA: Diagnosis not present

## 2023-05-25 MED ORDER — TECHNETIUM TC 99M SESTAMIBI - CARDIOLITE
25.0000 | Freq: Once | INTRAVENOUS | Status: AC | PRN
  Administered 2023-05-25: 24.19 via INTRAVENOUS

## 2023-05-27 NOTE — Progress Notes (Signed)
USN shows no abnormality of the thyroid, and does not localize a parathyroid adenoma.  Await results of sestamibi scan (still pending).  Darnell Level, MD [facility] practice Office: [phone]

## 2023-06-01 ENCOUNTER — Ambulatory Visit: Payer: Self-pay | Admitting: Surgery

## 2023-06-01 NOTE — Progress Notes (Signed)
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  Will proceed with minimally invasive parathyroidectomy as an outpatient as discussed in the office.  Tresa Endo - please forward orders to schedulers and have them contact the patient.  Darnell Level, MD [facility] practice Office: [phone]

## 2023-06-08 NOTE — Patient Instructions (Signed)
SURGICAL WAITING ROOM VISITATION  Patients having surgery or a procedure may have no more than 2 support people in the waiting area - these visitors may rotate.    Children under the age of 8 must have an adult with them who is not the patient.  Due to an increase in RSV and influenza rates and associated hospitalizations, children ages 31 and under may not visit patients in [facility] hospitals.  If the patient needs to stay at the hospital during part of their recovery, the visitor guidelines for inpatient rooms apply. Pre-op nurse will coordinate an appropriate time for 1 support person to accompany patient in pre-op.  This support person may not rotate.    Please refer to the [facility] website for the visitor guidelines for Inpatients (after your surgery is over and you are in a regular room).       Your procedure is scheduled on:  06/14/23    Report to [facility] Main Entrance    Report to admitting at  0830 AM   Call this number if you have problems the morning of surgery [phone]   Do not eat food :After Midnight.   After Midnight you may have the following liquids until ___ 0730___ AM DAY OF SURGERY  Water Non-Citrus Juices (without pulp, NO RED-Apple, White grape, White cranberry) Black Coffee (NO MILK/CREAM OR CREAMERS, sugar ok)  Clear Tea (NO MILK/CREAM OR CREAMERS, sugar ok) regular and decaf                             Plain Jell-O (NO RED)                                           Fruit ices (not with fruit pulp, NO RED)                                     Popsicles (NO RED)                                                               Sports drinks like Gatorade (NO RED)                    The day of surgery:  Drink ONE (1) Pre-Surgery Clear Ensure or G2 at 0730 AM ( have completed by )  the morning of surgery. Drink in one sitting. Do not sip.  This drink was given to you during your hospital  pre-op appointment visit. Nothing else to drink  after completing the  Pre-Surgery Clear Ensure or G2.          If you have questions, please contact your surgeon's office.       Oral Hygiene is also important to reduce your risk of infection.                                    Remember - BRUSH YOUR TEETH THE MORNING OF SURGERY WITH YOUR  REGULAR TOOTHPASTE  DENTURES WILL BE REMOVED PRIOR TO SURGERY PLEASE DO NOT APPLY "Poly grip" OR ADHESIVES!!!   Do NOT smoke after Midnight   Take these medicines the morning of surgery with A SIP OF WATER:  proscar, omeprazole, flomax   DO NOT TAKE ANY ORAL DIABETIC MEDICATIONS DAY OF YOUR SURGERY  Bring CPAP mask and tubing day of surgery.                              You may not have any metal on your body including hair pins, jewelry, and body piercing             Do not wear make-up, lotions, powders, perfumes/cologne, or deodorant  Do not wear nail polish including gel and S&S, artificial/acrylic nails, or any other type of covering on natural nails including finger and toenails. If you have artificial nails, gel coating, etc. that needs to be removed by a nail salon please have this removed prior to surgery or surgery may need to be canceled/ delayed if the surgeon/ anesthesia feels like they are unable to be safely monitored.   Do not shave  48 hours prior to surgery.               Men may shave face and neck.   Do not bring valuables to the hospital. [facility] IS NOT             RESPONSIBLE   FOR VALUABLES.   Contacts, glasses, dentures or bridgework may not be worn into surgery.   Bring small overnight bag day of surgery.   DO NOT BRING YOUR HOME MEDICATIONS TO THE HOSPITAL. PHARMACY WILL DISPENSE MEDICATIONS LISTED ON YOUR MEDICATION LIST TO YOU DURING YOUR ADMISSION IN THE HOSPITAL!    Patients discharged on the day of surgery will not be allowed to drive home.  Someone NEEDS to stay with you for the first 24 hours after anesthesia.   Special Instructions: Bring a copy of  your healthcare power of attorney and living will documents the day of surgery if you haven't scanned them before.              Please read over the following fact sheets you were given: IF YOU HAVE QUESTIONS ABOUT YOUR PRE-OP INSTRUCTIONS PLEASE CALL [phone]   If you received a COVID test during your pre-op visit  it is requested that you wear a mask when out in public, stay away from anyone that may not be feeling well and notify your surgeon if you develop symptoms. If you test positive for Covid or have been in contact with anyone that has tested positive in the last 10 days please notify you surgeon.    [facility] - Preparing for Surgery Before surgery, you can play an important role.  Because skin is not sterile, your skin needs to be as free of germs as possible.  You can reduce the number of germs on your skin by washing with CHG (chlorahexidine gluconate) soap before surgery.  CHG is an antiseptic cleaner which kills germs and bonds with the skin to continue killing germs even after washing. Please DO NOT use if you have an allergy to CHG or antibacterial soaps.  If your skin becomes reddened/irritated stop using the CHG and inform your nurse when you arrive at Short Stay. Do not shave (including legs and underarms) for at least 48 hours prior to the first CHG  shower.  You may shave your face/neck. Please follow these instructions carefully:  1.  Shower with CHG Soap the night before surgery and the  morning of Surgery.  2.  If you choose to wash your hair, wash your hair first as usual with your  normal  shampoo.  3.  After you shampoo, rinse your hair and body thoroughly to remove the  shampoo.                           4.  Use CHG as you would any other liquid soap.  You can apply chg directly  to the skin and wash                       Gently with a scrungie or clean washcloth.  5.  Apply the CHG Soap to your body ONLY FROM THE NECK DOWN.   Do not use on face/ open                            Wound or open sores. Avoid contact with eyes, ears mouth and genitals (private parts).                       Wash face,  Genitals (private parts) with your normal soap.             6.  Wash thoroughly, paying special attention to the area where your surgery  will be performed.  7.  Thoroughly rinse your body with warm water from the neck down.  8.  DO NOT shower/wash with your normal soap after using and rinsing off  the CHG Soap.                9.  Pat yourself dry with a clean towel.            10.  Wear clean pajamas.            11.  Place clean sheets on your bed the night of your first shower and do not  sleep with pets. Day of Surgery : Do not apply any lotions/deodorants the morning of surgery.  Please wear clean clothes to the hospital/surgery center.  FAILURE TO FOLLOW THESE INSTRUCTIONS MAY RESULT IN THE CANCELLATION OF YOUR SURGERY PATIENT SIGNATURE_________________________________  NURSE SIGNATURE__________________________________  ________________________________________________________________________

## 2023-06-11 ENCOUNTER — Other Ambulatory Visit: Payer: Self-pay

## 2023-06-11 ENCOUNTER — Encounter (HOSPITAL_COMMUNITY)
Admission: RE | Admit: 2023-06-11 | Discharge: 2023-06-11 | Disposition: A | Discharge status: Home / Self Care | Payer: PPO | Source: Ambulatory Visit | Attending: Surgery | Admitting: Surgery

## 2023-06-11 ENCOUNTER — Encounter (HOSPITAL_COMMUNITY): Payer: Self-pay

## 2023-06-11 VITALS — BP 136/83 | HR 77 | Temp 98.2°F | Resp 16 | Ht 71.0 in | Wt 202.0 lb

## 2023-06-11 DIAGNOSIS — I498 Other specified cardiac arrhythmias: Secondary | ICD-10-CM | POA: Diagnosis not present

## 2023-06-11 DIAGNOSIS — Z01818 Encounter for other preprocedural examination: Secondary | ICD-10-CM | POA: Insufficient documentation

## 2023-06-11 LAB — CBC
HCT: 49.1 % (ref 39.0–52.0)
Hemoglobin: 16.3 g/dL (ref 13.0–17.0)
MCH: 32.7 pg (ref 26.0–34.0)
MCHC: 33.2 g/dL (ref 30.0–36.0)
MCV: 98.6 fL (ref 80.0–100.0)
Platelets: 194 10*3/uL (ref 150–400)
RBC: 4.98 MIL/uL (ref 4.22–5.81)
RDW: 13.2 % (ref 11.5–15.5)
WBC: 6.6 10*3/uL (ref 4.0–10.5)
nRBC: 0 % (ref 0.0–0.2)

## 2023-06-11 LAB — BASIC METABOLIC PANEL
Anion gap: 10 (ref 5–15)
BUN: 19 mg/dL (ref 8–23)
CO2: 24 mmol/L (ref 22–32)
Calcium: 10.1 mg/dL (ref 8.9–10.3)
Chloride: 105 mmol/L (ref 98–111)
Creatinine, Ser: 0.97 mg/dL (ref 0.61–1.24)
GFR, Estimated: >60 mL/min (ref >60)
Glucose, Bld: 114 mg/dL — ABNORMAL HIGH (ref 70–99)
Potassium: 4.1 mmol/L (ref 3.5–5.1)
Sodium: 139 mmol/L (ref 135–145)

## 2023-06-11 NOTE — Progress Notes (Addendum)
Anesthesia Review:  PCP: DR Crawford Givens LOV 02/08/23  Cardiologist : none  Chest x-ray : EKG : 06/11/23  Echo : Stress test: Cardiac Cath :  Activity level:  can do a flight of stairs without difficutly  Sleep Study/ CPAP : none  Fasting Blood Sugar :      / Checks Blood Sugar -- times a day:   Blood Thinner/ Instructions /Last Dose: ASA / Instructions/ Last Dose :

## 2023-06-13 ENCOUNTER — Encounter (HOSPITAL_COMMUNITY): Payer: Self-pay | Admitting: Surgery

## 2023-06-13 DIAGNOSIS — E210 Primary hyperparathyroidism: Secondary | ICD-10-CM | POA: Diagnosis present

## 2023-06-13 NOTE — H&P (Signed)
REFERRING PHYSICIAN: Sunnie Nielsen, MD  PROVIDER: Kinsie Belford Myra Rude, MD   Chief Complaint: New Consultation (Primary hyperparathyroidism)  History of Present Illness:  Patient is referred by Dr. Crawford Givens for surgical evaluation and recommendations regarding suspected primary hyperparathyroidism. Patient was noted a few years ago to have an elevated serum calcium level. He was apparently evaluated in [place] by Dr. Duanne Guess. However, the evaluation does not appear to have been completed and the patient is now referred at this time for surgical consultation for suspected primary hyperparathyroidism. Patient denies any complications. He denies nephrolithiasis. He denies fatigue. He has had no recent fractures. He denies urinary frequency. He has never had a bone density scan. Recent laboratory studies show an elevated calcium level of 10.5 and an elevated intact PTH level of 104. Vitamin D level is normal at 33.6. Patient has not had any imaging studies performed. Patient has had no prior head or neck surgery. There is no family history of parathyroid disease or other endocrine neoplasm. Patient is accompanied today by his wife. He is retired. He worked as a Multimedia programmer.  Review of Systems: A complete review of systems was obtained from the patient. I have reviewed this information and discussed as appropriate with the patient. See HPI as well for other ROS.  Review of Systems  Constitutional: Negative.  HENT: Negative.  Eyes: Negative.  Respiratory: Negative.  Cardiovascular: Negative.  Gastrointestinal: Negative.  Genitourinary: Negative.  Musculoskeletal: Negative.  Skin: Negative.  Neurological: Negative.  Endo/Heme/Allergies: Negative.  Psychiatric/Behavioral: Negative.    Medical History: History reviewed. No pertinent past medical history.  Patient Active Problem List  Diagnosis  Primary hyperparathyroidism (CMS/HHS-HCC)   History  reviewed. No pertinent surgical history.   Allergies  Allergen Reactions  Lisinopril Other (See Comments)  cough   Current Outpatient Medications on File Prior to Visit  Medication Sig Dispense Refill  amLODIPine (NORVASC) 2.5 MG tablet  atorvastatin (LIPITOR) 80 MG tablet  brimonidine (ALPHAGAN) 0.2 % ophthalmic solution  finasteride (PROSCAR) 5 mg tablet  HYDROcodone-acetaminophen (NORCO) 5-325 mg tablet Take 1 tablet by mouth every 4 (four) hours as needed for Pain (Patient not taking: Reported on 09/24/2017 ) 20 tablet 0  losartan-hydrochlorothiazide (HYZAAR) 100-12.5 mg tablet  LUMIGAN 0.01 % ophthalmic solution  omeprazole (PRILOSEC) 20 MG DR capsule   No current facility-administered medications on file prior to visit.   History reviewed. No pertinent family history.   Social History   Tobacco Use  Smoking Status Never  Smokeless Tobacco Never    Social History   Socioeconomic History  Marital status: Married  Tobacco Use  Smoking status: Never  Smokeless tobacco: Never   Social Determinants of Health   Financial Resource Strain: Low Risk (02/04/2023)  Received from [facility]  Overall Financial Resource Strain (CARDIA)  Difficulty of Paying Living Expenses: Not hard at all  Food Insecurity: No Food Insecurity (02/04/2023)  [facility] from [facility]  Hunger Vital Sign  Worried About Running Out of Food in the Last Year: Never true  Ran Out of Food in the Last Year: Never true  Transportation Needs: No Transportation Needs (02/04/2023)  Received from [facility] - Transportation  Lack of Transportation (Medical): No  Lack of Transportation (Non-Medical): No  Physical Activity: Insufficiently Active (02/04/2023)  Received from [facility]  Exercise Vital Sign  Days of Exercise per Week: 3 days  Minutes of Exercise per Session: 20 min  Stress: No Stress Concern Present (02/04/2023)  Received from [facility]  - Occupational Stress Questionnaire  Feeling of Stress : Only a little  Social Connections: Socially Integrated (02/04/2023)  Received from [facility]  Social Connection and Isolation Panel [NHANES]  Frequency of Communication with Friends and Family: More than three times a week  Frequency of Social Gatherings with Friends and Family: More than three times a week  Attends Religious Services: More than 4 times per year  Active Member of [employer] or Organizations: Yes  Attends Banker Meetings: 1 to 4 times per year  Marital Status: Married   Objective:   There were no vitals filed for this visit.  There is no height or weight on file to calculate BMI.  Physical Exam   GENERAL APPEARANCE Comfortable, no acute issues Development: normal Gross deformities: none  SKIN Rash, lesions, ulcers: none Induration, erythema: none Nodules: none palpable  EYES Conjunctiva and lids: normal Pupils: equal and reactive  EARS, NOSE, MOUTH, THROAT External ears: no lesion or deformity External nose: no lesion or deformity Hearing: grossly normal  NECK Symmetric: yes Trachea: midline Thyroid: no palpable nodules in the thyroid bed  ABDOMEN Not assessed  GENITOURINARY/RECTAL Not assessed  MUSCULOSKELETAL Station and gait: normal Digits and nails: no clubbing or cyanosis Muscle strength: grossly normal all extremities Range of motion: grossly normal all extremities Deformity: none  LYMPHATIC Cervical: none palpable Supraclavicular: none palpable  PSYCHIATRIC Oriented to person, place, and time: yes Mood and affect: normal for situation Judgment and insight: appropriate for situation   Assessment and Plan:   Primary hyperparathyroidism (CMS/HHS-HCC)  Patient is referred by his primary care physician for surgical evaluation and recommendations regarding suspected primary hyperparathyroidism.  Patient provided with a copy of "Parathyroid Surgery: Treatment for  Your Parathyroid Gland Problem", published by [employer], 12 pages. Book reviewed and explained to patient during visit today.  Today we reviewed his clinical history. We reviewed his laboratory studies. We discussed potential symptoms. I have recommended proceeding with further diagnostic testing to include a 24-hour urine collection for calcium, and ultrasound examination of the neck, and a nuclear medicine parathyroid scan with sestamibi. We will contact him with the results of the studies. If these do not confirm a parathyroid adenoma, then we will consider proceeding with a 4D CT scan of the neck.  Today we discussed minimally invasive parathyroid surgery. We discussed the size and location of the incision. We discussed doing this as an outpatient procedure. We discussed his postoperative recovery. The patient understands.  We will obtain the above studies. I will contact the patient with the results when they are available and we will discuss further management at that time.   Darnell Level, MD [facility] practice Office: [phone]

## 2023-06-14 ENCOUNTER — Other Ambulatory Visit: Payer: Self-pay

## 2023-06-14 ENCOUNTER — Ambulatory Visit (HOSPITAL_COMMUNITY): Payer: PPO | Admitting: Physician Assistant

## 2023-06-14 ENCOUNTER — Encounter (HOSPITAL_COMMUNITY)
Admission: RE | Disposition: A | Discharge status: Home / Self Care | Payer: Self-pay | Source: Home / Self Care | Attending: Surgery

## 2023-06-14 ENCOUNTER — Encounter (HOSPITAL_COMMUNITY): Payer: Self-pay | Admitting: Surgery

## 2023-06-14 ENCOUNTER — Ambulatory Visit (HOSPITAL_BASED_OUTPATIENT_CLINIC_OR_DEPARTMENT_OTHER): Payer: PPO | Admitting: Anesthesiology

## 2023-06-14 ENCOUNTER — Ambulatory Visit (HOSPITAL_COMMUNITY)
Admission: RE | Admit: 2023-06-14 | Discharge: 2023-06-14 | Disposition: A | Discharge status: Home / Self Care | Payer: PPO | Attending: Surgery | Admitting: Surgery

## 2023-06-14 DIAGNOSIS — K219 Gastro-esophageal reflux disease without esophagitis: Secondary | ICD-10-CM | POA: Diagnosis not present

## 2023-06-14 DIAGNOSIS — E785 Hyperlipidemia, unspecified: Secondary | ICD-10-CM

## 2023-06-14 DIAGNOSIS — Z79899 Other long term (current) drug therapy: Secondary | ICD-10-CM | POA: Insufficient documentation

## 2023-06-14 DIAGNOSIS — I10 Essential (primary) hypertension: Secondary | ICD-10-CM | POA: Diagnosis not present

## 2023-06-14 DIAGNOSIS — Z01818 Encounter for other preprocedural examination: Secondary | ICD-10-CM

## 2023-06-14 DIAGNOSIS — K644 Residual hemorrhoidal skin tags: Secondary | ICD-10-CM | POA: Diagnosis not present

## 2023-06-14 DIAGNOSIS — D351 Benign neoplasm of parathyroid gland: Secondary | ICD-10-CM | POA: Insufficient documentation

## 2023-06-14 DIAGNOSIS — E213 Hyperparathyroidism, unspecified: Secondary | ICD-10-CM | POA: Diagnosis not present

## 2023-06-14 DIAGNOSIS — E210 Primary hyperparathyroidism: Secondary | ICD-10-CM | POA: Diagnosis not present

## 2023-06-14 HISTORY — PX: PARATHYROIDECTOMY: SHX19

## 2023-06-14 SURGERY — PARATHYROIDECTOMY
Anesthesia: General | Site: Neck | Laterality: Right

## 2023-06-14 MED ORDER — SUGAMMADEX SODIUM 200 MG/2ML IV SOLN
INTRAVENOUS | Status: DC | PRN
  Administered 2023-06-14: 200 mg via INTRAVENOUS

## 2023-06-14 MED ORDER — LIDOCAINE HCL (CARDIAC) PF 100 MG/5ML IV SOSY
PREFILLED_SYRINGE | INTRAVENOUS | Status: DC | PRN
  Administered 2023-06-14: 100 mg via INTRAVENOUS

## 2023-06-14 MED ORDER — DEXAMETHASONE SODIUM PHOSPHATE 10 MG/ML IJ SOLN
INTRAMUSCULAR | Status: AC
  Filled 2023-06-14: qty 1

## 2023-06-14 MED ORDER — FENTANYL CITRATE PF 50 MCG/ML IJ SOSY: 25.0000 ug | PREFILLED_SYRINGE | INTRAMUSCULAR | Status: DC | PRN

## 2023-06-14 MED ORDER — ONDANSETRON HCL 4 MG/2ML IJ SOLN
INTRAMUSCULAR | Status: AC
  Filled 2023-06-14: qty 2

## 2023-06-14 MED ORDER — PHENYLEPHRINE 80 MCG/ML (10ML) SYRINGE FOR IV PUSH (FOR BLOOD PRESSURE SUPPORT)
PREFILLED_SYRINGE | INTRAVENOUS | Status: DC | PRN
  Administered 2023-06-14 (×2): 160 ug via INTRAVENOUS

## 2023-06-14 MED ORDER — ROCURONIUM BROMIDE 10 MG/ML (PF) SYRINGE
PREFILLED_SYRINGE | INTRAVENOUS | Status: AC
  Filled 2023-06-14: qty 10

## 2023-06-14 MED ORDER — ACETAMINOPHEN 325 MG PO TABS
325.0000 mg | ORAL_TABLET | ORAL | Status: DC | PRN
  Administered 2023-06-14: 650 mg via ORAL

## 2023-06-14 MED ORDER — OXYCODONE HCL 5 MG PO TABS
5.0000 mg | ORAL_TABLET | Freq: Once | ORAL | Status: AC | PRN
  Administered 2023-06-14: 5 mg via ORAL

## 2023-06-14 MED ORDER — BUPIVACAINE HCL (PF) 0.25 % IJ SOLN
INTRAMUSCULAR | Status: AC
  Filled 2023-06-14: qty 30

## 2023-06-14 MED ORDER — CHLORHEXIDINE GLUCONATE CLOTH 2 % EX PADS: 6.0000 | MEDICATED_PAD | Freq: Once | CUTANEOUS | Status: DC

## 2023-06-14 MED ORDER — ORAL CARE MOUTH RINSE: 15.0000 mL | Freq: Once | OROMUCOSAL | Status: AC

## 2023-06-14 MED ORDER — FENTANYL CITRATE (PF) 250 MCG/5ML IJ SOLN
INTRAMUSCULAR | Status: DC | PRN
  Administered 2023-06-14 (×5): 50 ug via INTRAVENOUS

## 2023-06-14 MED ORDER — CHLORHEXIDINE GLUCONATE 0.12 % MT SOLN
15.0000 mL | Freq: Once | OROMUCOSAL | Status: AC
  Administered 2023-06-14: 15 mL via OROMUCOSAL

## 2023-06-14 MED ORDER — MEPERIDINE HCL 50 MG/ML IJ SOLN: 6.2500 mg | INTRAMUSCULAR | Status: DC | PRN

## 2023-06-14 MED ORDER — ACETAMINOPHEN 160 MG/5ML PO SOLN: 325.0000 mg | ORAL | Status: DC | PRN

## 2023-06-14 MED ORDER — ONDANSETRON HCL 4 MG/2ML IJ SOLN
INTRAMUSCULAR | Status: DC | PRN
  Administered 2023-06-14: 4 mg via INTRAVENOUS

## 2023-06-14 MED ORDER — LACTATED RINGERS IV SOLN: INTRAVENOUS | Status: DC

## 2023-06-14 MED ORDER — LABETALOL HCL 5 MG/ML IV SOLN
INTRAVENOUS | Status: DC | PRN
  Administered 2023-06-14: 5 mg via INTRAVENOUS

## 2023-06-14 MED ORDER — LIDOCAINE HCL (PF) 2 % IJ SOLN
INTRAMUSCULAR | Status: AC
  Filled 2023-06-14: qty 5

## 2023-06-14 MED ORDER — ONDANSETRON HCL 4 MG/2ML IJ SOLN: 4.0000 mg | Freq: Once | INTRAMUSCULAR | Status: DC | PRN

## 2023-06-14 MED ORDER — HEMOSTATIC AGENTS (NO CHARGE) OPTIME
TOPICAL | Status: DC | PRN
  Administered 2023-06-14: 1 via TOPICAL

## 2023-06-14 MED ORDER — OXYCODONE HCL 5 MG PO TABS
ORAL_TABLET | ORAL | Status: AC
  Filled 2023-06-14: qty 1

## 2023-06-14 MED ORDER — BUPIVACAINE HCL 0.25 % IJ SOLN
INTRAMUSCULAR | Status: DC | PRN
  Administered 2023-06-14: 8 mL

## 2023-06-14 MED ORDER — LABETALOL HCL 5 MG/ML IV SOLN
INTRAVENOUS | Status: AC
  Filled 2023-06-14: qty 4

## 2023-06-14 MED ORDER — 0.9 % SODIUM CHLORIDE (POUR BTL) OPTIME
TOPICAL | Status: DC | PRN
  Administered 2023-06-14: 1000 mL

## 2023-06-14 MED ORDER — ROCURONIUM BROMIDE 10 MG/ML (PF) SYRINGE
PREFILLED_SYRINGE | INTRAVENOUS | Status: DC | PRN
  Administered 2023-06-14: 50 mg via INTRAVENOUS

## 2023-06-14 MED ORDER — CEFAZOLIN SODIUM-DEXTROSE 2-4 GM/100ML-% IV SOLN
2.0000 g | INTRAVENOUS | Status: AC
  Administered 2023-06-14: 2 g via INTRAVENOUS
  Filled 2023-06-14: qty 100

## 2023-06-14 MED ORDER — OXYCODONE HCL 5 MG/5ML PO SOLN: 5.0000 mg | Freq: Once | ORAL | Status: AC | PRN

## 2023-06-14 MED ORDER — TRAMADOL HCL 50 MG PO TABS: 50.0000 mg | ORAL_TABLET | Freq: Four times a day (QID) | ORAL | 0 refills | Status: DC | PRN

## 2023-06-14 MED ORDER — ACETAMINOPHEN 325 MG PO TABS
ORAL_TABLET | ORAL | Status: AC
  Filled 2023-06-14: qty 2

## 2023-06-14 MED ORDER — PROPOFOL 10 MG/ML IV BOLUS
INTRAVENOUS | Status: AC
  Filled 2023-06-14: qty 20

## 2023-06-14 MED ORDER — PROPOFOL 10 MG/ML IV BOLUS
INTRAVENOUS | Status: DC | PRN
  Administered 2023-06-14: 150 mg via INTRAVENOUS

## 2023-06-14 MED ORDER — FENTANYL CITRATE (PF) 250 MCG/5ML IJ SOLN
INTRAMUSCULAR | Status: AC
  Filled 2023-06-14: qty 5

## 2023-06-14 MED ORDER — DEXAMETHASONE SODIUM PHOSPHATE 10 MG/ML IJ SOLN
INTRAMUSCULAR | Status: DC | PRN
  Administered 2023-06-14: 8 mg via INTRAVENOUS

## 2023-06-14 SURGICAL SUPPLY — 35 items
ADH SKN CLS APL DERMABOND .7 (GAUZE/BANDAGES/DRESSINGS) ×1
APL PRP STRL LF DISP 70% ISPRP (MISCELLANEOUS) ×1
ATTRACTOMAT 16X20 MAGNETIC DRP (DRAPES) ×1 IMPLANT
BAG COUNTER SPONGE SURGICOUNT (BAG) ×1 IMPLANT
BAG SPNG CNTER NS LX DISP (BAG) ×1
BLADE SURG 15 STRL LF DISP TIS (BLADE) ×1 IMPLANT
BLADE SURG 15 STRL SS (BLADE) ×1
CHLORAPREP W/TINT 26 (MISCELLANEOUS) ×1 IMPLANT
CLIP TI MEDIUM 6 (CLIP) ×2 IMPLANT
CLIP TI WIDE RED SMALL 6 (CLIP) ×2 IMPLANT
COVER SURGICAL LIGHT HANDLE (MISCELLANEOUS) ×1 IMPLANT
DERMABOND ADVANCED .7 DNX12 (GAUZE/BANDAGES/DRESSINGS) ×1 IMPLANT
DRAPE LAPAROTOMY T 98X78 PEDS (DRAPES) ×1 IMPLANT
DRAPE UTILITY XL STRL (DRAPES) ×1 IMPLANT
ELECT REM PT RETURN 15FT ADLT (MISCELLANEOUS) ×1 IMPLANT
GAUZE 4X4 16PLY ~~LOC~~+RFID DBL (SPONGE) ×2 IMPLANT
GLOVE SURG ORTHO 8.0 STRL STRW (GLOVE) ×1 IMPLANT
GOWN STRL REUS W/ TWL XL LVL3 (GOWN DISPOSABLE) ×3 IMPLANT
GOWN STRL REUS W/TWL XL LVL3 (GOWN DISPOSABLE) ×3
HEMOSTAT SURGICEL 2X4 FIBR (HEMOSTASIS) ×1 IMPLANT
ILLUMINATOR WAVEGUIDE N/F (MISCELLANEOUS) IMPLANT
KIT BASIN OR (CUSTOM PROCEDURE TRAY) ×1 IMPLANT
KIT TURNOVER KIT A (KITS) IMPLANT
NDL HYPO 22X1.5 SAFETY MO (MISCELLANEOUS) ×2 IMPLANT
NEEDLE HYPO 22X1.5 SAFETY MO (MISCELLANEOUS) ×1
PACK BASIC VI WITH GOWN DISP (CUSTOM PROCEDURE TRAY) ×1 IMPLANT
PENCIL SMOKE EVACUATOR (MISCELLANEOUS) ×2 IMPLANT
SHEARS HARMONIC 9CM CVD (BLADE) IMPLANT
SUT MNCRL AB 4-0 PS2 18 (SUTURE) ×1 IMPLANT
SUT VIC AB 3-0 SH 18 (SUTURE) ×1 IMPLANT
SYR BULB IRRIG 60ML STRL (SYRINGE) ×1 IMPLANT
SYR CONTROL 10ML LL (SYRINGE) ×2 IMPLANT
TOWEL OR 17X26 10 PK STRL BLUE (TOWEL DISPOSABLE) ×1 IMPLANT
TOWEL OR NON WOVEN STRL DISP B (DISPOSABLE) ×1 IMPLANT
TUBING CONNECTING 10 (TUBING) ×2 IMPLANT

## 2023-06-14 NOTE — Discharge Instructions (Addendum)

## 2023-06-14 NOTE — Interval H&P Note (Signed)
History and Physical Interval Note:  06/14/2023 10:29 AM  Raymond Kirby  has presented today for surgery, with the diagnosis of PRIMARY HYPERPARATHYROIDISM.  The various methods of treatment have been discussed with the patient and family. After consideration of risks, benefits and other options for treatment, the patient has consented to    Procedure(s): RIGHT INFERIOR PARATHYROIDECTOMY (Right) as a surgical intervention.    The patient's history has been reviewed, patient examined, no change in status, stable for surgery.  I have reviewed the patient's chart and labs.  Questions were answered to the patient's satisfaction.    Darnell Level, MD [facility] practice Office: [phone]   Darnell Level

## 2023-06-14 NOTE — Anesthesia Procedure Notes (Signed)
Procedure Name: Intubation Date/Time: 06/14/2023 11:11 AM  Performed by: Maurene Capes, CRNAPre-anesthesia Checklist: Patient identified, Emergency Drugs available, Suction available and Patient being monitored Patient Re-evaluated:Patient Re-evaluated prior to induction Oxygen Delivery Method: Circle System Utilized Preoxygenation: Pre-oxygenation with 100% oxygen Induction Type: IV induction Ventilation: Mask ventilation without difficulty Laryngoscope Size: Mac and 4 Grade View: Grade II Tube type: Oral Tube size: 8.0 mm Number of attempts: 1 Airway Equipment and Method: Stylet and Oral airway Placement Confirmation: ETT inserted through vocal cords under direct vision, positive ETCO2 and breath sounds checked- equal and bilateral Secured at: 23 cm Tube secured with: Tape Dental Injury: Teeth and Oropharynx as per pre-operative assessment

## 2023-06-14 NOTE — Anesthesia Postprocedure Evaluation (Signed)
Anesthesia Post Note  Patient: Raymond Kirby  Procedure(s) Performed: RIGHT INFERIOR PARATHYROIDECTOMY (Right: Neck)     Patient location during evaluation: PACU Anesthesia Type: General Level of consciousness: awake and alert Pain management: pain level controlled Vital Signs Assessment: post-procedure vital signs reviewed and stable Respiratory status: spontaneous breathing, nonlabored ventilation, respiratory function stable and patient connected to nasal cannula oxygen Cardiovascular status: blood pressure returned to baseline and stable Postop Assessment: no apparent nausea or vomiting Anesthetic complications: no   No notable events documented.  Last Vitals:  Vitals:   06/14/23 1315 06/14/23 1330  BP: (!) 177/88   Pulse: (!) 55 62  Resp: 13 19  Temp: 36.8 C   SpO2: 98% 98%    Last Pain:  Vitals:   06/14/23 1315  TempSrc:   PainSc: 3                  Willia Lampert

## 2023-06-14 NOTE — Transfer of Care (Signed)
Immediate Anesthesia Transfer of Care Note  Patient: Raymond Kirby [facility]  Procedure(s) Performed: RIGHT INFERIOR PARATHYROIDECTOMY (Right: Neck)  Patient Location: PACU  Anesthesia Type:General  Level of Consciousness: awake, alert , and oriented  Airway & Oxygen Therapy: Patient Spontanous Breathing and Patient connected to face mask oxygen  Post-op Assessment: Report given to RN and Post -op Vital signs reviewed and stable  Post vital signs: Reviewed and stable  Last Vitals:  Vitals Value Taken Time  BP 157/91 06/14/23 1232  Temp    Pulse 61 06/14/23 1233  Resp 10 06/14/23 1232  SpO2 100 % 06/14/23 1233  Vitals shown include unfiled device data.  Last Pain:  Vitals:   06/14/23 0851  TempSrc:   PainSc: 0-No pain         Complications: No notable events documented.

## 2023-06-14 NOTE — Op Note (Signed)
OPERATIVE REPORT - PARATHYROIDECTOMY  Preoperative diagnosis: Primary hyperparathyroidism  Postop diagnosis: Same  Procedure: Right superior minimally invasive parathyroidectomy  Surgeon:  Darnell Level, MD  Assistant:  Saunders Glance, PA-C  Anesthesia: General endotracheal  Estimated blood loss: Minimal  Preparation: ChloraPrep  Indications: Patient is referred by Dr. Crawford Givens for surgical evaluation and recommendations regarding suspected primary hyperparathyroidism. Patient was noted a few years ago to have an elevated serum calcium level. He was apparently evaluated in [place] by Dr. Duanne Guess. However, the evaluation does not appear to have been completed and the patient is now referred at this time for surgical consultation for suspected primary hyperparathyroidism. Patient denies any complications. He denies nephrolithiasis. He denies fatigue. He has had no recent fractures. He denies urinary frequency. He has never had a bone density scan. Recent laboratory studies show an elevated calcium level of 10.5 and an elevated intact PTH level of 104. Vitamin D level is normal at 33.6.  Nuclear medicine parathyroid scan with sestamibi localized what appeared to be a right inferior parathyroid adenoma.  Patient now comes to surgery for parathyroidectomy.  Procedure: The patient was prepared in the pre-operative holding area. The patient was brought to the operating room and placed in a supine position on the operating room table. Following administration of general anesthesia, the patient was positioned and then prepped and draped in the usual strict aseptic fashion. After ascertaining that an adequate level of anesthesia been achieved, a neck incision was made with a #15 blade. Dissection was carried through subcutaneous tissues and platysma. Hemostasis was obtained with the electrocautery. Skin flaps were developed circumferentially and a Weitlander retractor was placed for  exposure.  Strap muscles were incised in the midline. Strap muscles were reflected laterally exposing the thyroid lobe. With gentle blunt dissection the thyroid lobe was mobilized.  At the inferior pole of the thyroid at the beginning of the thyro-thymic tract is a normal appearing parathyroid gland.  This represents the inferior parathyroid. Dissection was carried posteriorly and an enlarged parathyroid gland was identified just superior to the inferior thyroid artery representing a superior parathyroid gland.. It was gently mobilized. Vascular structures were divided between small ligaclips. Care was taken to avoid the recurrent laryngeal nerve. The parathyroid gland was completely excised. It was submitted to pathology where frozen section confirmed hypercellular parathyroid tissue consistent with adenoma.  Neck was irrigated with warm saline and good hemostasis was noted. Fibrillar was placed in the operative field. Strap muscles were approximated in the midline with interrupted 3-0 Vicryl sutures. Platysma was closed with interrupted 3-0 Vicryl sutures. Marcaine was infiltrated circumferentially. Skin was closed with a running 4-0 Monocryl subcuticular suture. Wound was washed and dried and Dermabond was applied. Patient was awakened from anesthesia and brought to the recovery room. The patient tolerated the procedure well.   Darnell Level, MD [facility] Surgery Office: [phone]

## 2023-06-14 NOTE — Anesthesia Preprocedure Evaluation (Addendum)
Anesthesia Evaluation  Patient identified by MRN, date of birth, ID band Patient awake    Reviewed: Allergy & Precautions, H&P , NPO status , Patient's Chart, lab work & pertinent test results  Airway Mallampati: III  TM Distance: >3 FB Neck ROM: Full    Dental no notable dental hx. (+) Poor Dentition, Dental Advisory Given   Pulmonary neg pulmonary ROS   Pulmonary exam normal breath sounds clear to auscultation       Cardiovascular Exercise Tolerance: Good hypertension, Pt. on medications negative cardio ROS Normal cardiovascular exam Rhythm:Regular Rate:Normal     Neuro/Psych negative neurological ROS  negative psych ROS   GI/Hepatic negative GI ROS, Neg liver ROS,GERD  Medicated,,  Endo/Other  negative endocrine ROS    Renal/GU negative Renal ROS  negative genitourinary   Musculoskeletal negative musculoskeletal ROS (+)    Abdominal   Peds negative pediatric ROS (+)  Hematology negative hematology ROS (+)   Anesthesia Other Findings   Reproductive/Obstetrics negative OB ROS                             Anesthesia Physical Anesthesia Plan  ASA: 3  Anesthesia Plan: General   Post-op Pain Management: Minimal or no pain anticipated and Ofirmev IV (intra-op)*   Induction: Intravenous  PONV Risk Score and Plan: 2 and Ondansetron, Dexamethasone and Treatment may vary due to age or medical condition  Airway Management Planned: Oral ETT  Additional Equipment: None  Intra-op Plan:   Post-operative Plan: Extubation in OR  Informed Consent:   Plan Discussed with: Anesthesiologist and CRNA  Anesthesia Plan Comments: (  )       Anesthesia Quick Evaluation

## 2023-06-15 ENCOUNTER — Encounter (HOSPITAL_COMMUNITY): Payer: Self-pay | Admitting: Surgery

## 2023-06-16 NOTE — Progress Notes (Signed)
     Agree with the path finding of a parathyroid adenoma.  Darnell Level, MD [facility] practice Office: [phone]

## 2023-07-12 DIAGNOSIS — Z9089 Acquired absence of other organs: Secondary | ICD-10-CM | POA: Diagnosis not present

## 2023-07-12 DIAGNOSIS — E210 Primary hyperparathyroidism: Secondary | ICD-10-CM | POA: Diagnosis not present

## 2023-07-12 DIAGNOSIS — Z9889 Other specified postprocedural states: Secondary | ICD-10-CM | POA: Diagnosis not present

## 2023-07-22 DIAGNOSIS — Z9889 Other specified postprocedural states: Secondary | ICD-10-CM | POA: Insufficient documentation

## 2023-07-28 DIAGNOSIS — Z961 Presence of intraocular lens: Secondary | ICD-10-CM | POA: Diagnosis not present

## 2023-07-28 DIAGNOSIS — H401112 Primary open-angle glaucoma, right eye, moderate stage: Secondary | ICD-10-CM | POA: Diagnosis not present

## 2023-09-07 ENCOUNTER — Telehealth: Payer: Self-pay | Admitting: *Deleted

## 2023-09-07 NOTE — Telephone Encounter (Signed)
Sent info regarding cardiology for pt's wife.

## 2024-01-08 ENCOUNTER — Other Ambulatory Visit: Payer: Self-pay | Admitting: Family Medicine

## 2024-01-25 ENCOUNTER — Other Ambulatory Visit: Payer: Self-pay | Admitting: Family Medicine

## 2024-02-02 DIAGNOSIS — H401112 Primary open-angle glaucoma, right eye, moderate stage: Secondary | ICD-10-CM | POA: Diagnosis not present

## 2024-02-02 DIAGNOSIS — H43813 Vitreous degeneration, bilateral: Secondary | ICD-10-CM | POA: Diagnosis not present

## 2024-02-02 DIAGNOSIS — Z961 Presence of intraocular lens: Secondary | ICD-10-CM | POA: Diagnosis not present

## 2024-02-10 ENCOUNTER — Other Ambulatory Visit: Payer: Self-pay | Admitting: Family Medicine

## 2024-02-10 DIAGNOSIS — I10 Essential (primary) hypertension: Secondary | ICD-10-CM

## 2024-02-10 DIAGNOSIS — E785 Hyperlipidemia, unspecified: Secondary | ICD-10-CM

## 2024-02-10 DIAGNOSIS — N400 Enlarged prostate without lower urinary tract symptoms: Secondary | ICD-10-CM

## 2024-02-10 DIAGNOSIS — R739 Hyperglycemia, unspecified: Secondary | ICD-10-CM

## 2024-02-10 DIAGNOSIS — E210 Primary hyperparathyroidism: Secondary | ICD-10-CM

## 2024-02-10 DIAGNOSIS — Z125 Encounter for screening for malignant neoplasm of prostate: Secondary | ICD-10-CM

## 2024-02-11 ENCOUNTER — Other Ambulatory Visit

## 2024-02-11 DIAGNOSIS — R739 Hyperglycemia, unspecified: Secondary | ICD-10-CM

## 2024-02-11 DIAGNOSIS — N400 Enlarged prostate without lower urinary tract symptoms: Secondary | ICD-10-CM

## 2024-02-11 DIAGNOSIS — E785 Hyperlipidemia, unspecified: Secondary | ICD-10-CM | POA: Diagnosis not present

## 2024-02-11 DIAGNOSIS — I10 Essential (primary) hypertension: Secondary | ICD-10-CM

## 2024-02-11 DIAGNOSIS — E210 Primary hyperparathyroidism: Secondary | ICD-10-CM | POA: Diagnosis not present

## 2024-02-11 DIAGNOSIS — Z125 Encounter for screening for malignant neoplasm of prostate: Secondary | ICD-10-CM | POA: Diagnosis not present

## 2024-02-11 LAB — CBC WITH DIFFERENTIAL/PLATELET
Basophils Absolute: 0.1 10*3/uL (ref 0.0–0.1)
Basophils Relative: 1.4 % (ref 0.0–3.0)
Eosinophils Absolute: 0.5 10*3/uL (ref 0.0–0.7)
Eosinophils Relative: 7.9 % — ABNORMAL HIGH (ref 0.0–5.0)
HCT: 46.7 % (ref 39.0–52.0)
Hemoglobin: 15.6 g/dL (ref 13.0–17.0)
Lymphocytes Relative: 20.7 % (ref 12.0–46.0)
Lymphs Abs: 1.3 10*3/uL (ref 0.7–4.0)
MCHC: 33.3 g/dL (ref 30.0–36.0)
MCV: 98.5 fl (ref 78.0–100.0)
Monocytes Absolute: 0.6 10*3/uL (ref 0.1–1.0)
Monocytes Relative: 9.9 % (ref 3.0–12.0)
Neutro Abs: 3.6 10*3/uL (ref 1.4–7.7)
Neutrophils Relative %: 60.1 % (ref 43.0–77.0)
Platelets: 194 10*3/uL (ref 150.0–400.0)
RBC: 4.74 Mil/uL (ref 4.22–5.81)
RDW: 14.1 % (ref 11.5–15.5)
WBC: 6.1 10*3/uL (ref 4.0–10.5)

## 2024-02-11 LAB — LIPID PANEL
Cholesterol: 192 mg/dL (ref 0–200)
HDL: 57.4 mg/dL (ref >39.00)
LDL Cholesterol: 113 mg/dL — ABNORMAL HIGH (ref 0–99)
NonHDL: 135.07
Total CHOL/HDL Ratio: 3
Triglycerides: 112 mg/dL (ref 0.0–149.0)
VLDL: 22.4 mg/dL (ref 0.0–40.0)

## 2024-02-11 LAB — COMPREHENSIVE METABOLIC PANEL WITH GFR
ALT: 18 U/L (ref 0–53)
AST: 15 U/L (ref 0–37)
Albumin: 4.5 g/dL (ref 3.5–5.2)
Alkaline Phosphatase: 45 U/L (ref 39–117)
BUN: 14 mg/dL (ref 6–23)
CO2: 31 meq/L (ref 19–32)
Calcium: 9.5 mg/dL (ref 8.4–10.5)
Chloride: 100 meq/L (ref 96–112)
Creatinine, Ser: 1.02 mg/dL (ref 0.40–1.50)
GFR: 71.46 mL/min (ref >60.00)
Glucose, Bld: 132 mg/dL — ABNORMAL HIGH (ref 70–99)
Potassium: 4.5 meq/L (ref 3.5–5.1)
Sodium: 140 meq/L (ref 135–145)
Total Bilirubin: 0.5 mg/dL (ref 0.2–1.2)
Total Protein: 6.8 g/dL (ref 6.0–8.3)

## 2024-02-11 LAB — HEMOGLOBIN A1C: Hgb A1c MFr Bld: 6.6 % — ABNORMAL HIGH (ref 4.6–6.5)

## 2024-02-11 LAB — VITAMIN D 25 HYDROXY (VIT D DEFICIENCY, FRACTURES): VITD: 40.76 ng/mL (ref 30.00–100.00)

## 2024-02-11 LAB — TSH: TSH: 1.95 u[IU]/mL (ref 0.35–5.50)

## 2024-02-12 LAB — PTH, INTACT AND CALCIUM
Calcium: 9.4 mg/dL (ref 8.6–10.3)
PTH: 24 pg/mL (ref 16–77)

## 2024-02-13 ENCOUNTER — Other Ambulatory Visit: Payer: Self-pay | Admitting: Family Medicine

## 2024-02-13 DIAGNOSIS — Z125 Encounter for screening for malignant neoplasm of prostate: Secondary | ICD-10-CM

## 2024-02-18 ENCOUNTER — Other Ambulatory Visit: Payer: PPO

## 2024-02-22 ENCOUNTER — Ambulatory Visit (INDEPENDENT_AMBULATORY_CARE_PROVIDER_SITE_OTHER): Payer: PPO

## 2024-02-22 VITALS — Ht 71.0 in | Wt 202.0 lb

## 2024-02-22 DIAGNOSIS — Z0000 Encounter for general adult medical examination without abnormal findings: Secondary | ICD-10-CM

## 2024-02-22 NOTE — Patient Instructions (Signed)
 Raymond Kirby , Thank you for taking time to come for your Medicare Wellness Visit. I appreciate your ongoing commitment to your health goals. Please review the following plan we discussed and let me know if I can assist you in the future.   Referrals/Orders/Follow-Ups/Clinician Recommendations: none  This is a list of the screening recommended for you and due dates:  Health Maintenance  Topic Date Due   Zoster (Shingles) Vaccine (1 of 2) 09/25/1966   Flu Shot  06/16/2024   Medicare Annual Wellness Visit  02/21/2025   DTaP/Tdap/Td vaccine (4 - Td or Tdap) 09/16/2027   Pneumonia Vaccine  Completed   Hepatitis C Screening  Completed   HPV Vaccine  Aged Out   Colon Cancer Screening  Discontinued   COVID-19 Vaccine  Discontinued    Advanced directives: (Copy Requested) Please bring a copy of your health care power of attorney and living will to the office to be added to your chart at your convenience. You can mail to [facility] [address] [place], [place] [address] or email to [email or web address]  Next Medicare Annual Wellness Visit scheduled for next year: Yes 02/23/24 @1 :40pm televisit

## 2024-02-22 NOTE — Progress Notes (Signed)
 Subjective:   Raymond Kirby is a 78 y.o. who presents for a Medicare Wellness preventive visit.  Visit Complete: Virtual I connected with  Raymond Kirby on 02/22/24 by a audio enabled telemedicine application and verified that I am speaking with the correct person using two identifiers.  Patient Location: Home  Provider Location: Office/Clinic  I discussed the limitations of evaluation and management by telemedicine. The patient expressed understanding and agreed to proceed.  Vital Signs: Because this visit was a virtual/telehealth visit, some criteria may be missing or patient reported. Any vitals not documented were not able to be obtained and vitals that have been documented are patient reported.  VideoDeclined- This patient declined Librarian, academic. Therefore the visit was completed with audio only.  Persons Participating in Visit: Patient.  AWV Questionnaire: No: Patient Medicare AWV questionnaire was not completed prior to this visit.  Cardiac Risk Factors include: advanced age (>22men, >76 women);dyslipidemia;male gender;hypertension;sedentary lifestyle     Objective:    Today's Vitals   02/22/24 1352  Weight: 202 lb (91.6 kg)  Height: 5\' 11"  (1.803 m)   Body mass index is 28.17 kg/m.     02/22/2024    2:05 PM 06/14/2023    8:46 AM 06/11/2023   11:02 AM 02/04/2023   10:17 AM 01/29/2021    3:29 PM 04/16/2020   12:21 PM 03/19/2020    8:03 AM  Advanced Directives  Does Patient Have a Medical Advance Directive? Yes Yes Yes Yes Yes Yes Yes  Type of Estate agent of [place];Living will Healthcare Power of [place];Living will Healthcare Power of [place];Living will Healthcare Power of [place];Living will Healthcare Power of [place];Living will Living will;Healthcare Power of [employer] Power of [place];Living will  Does patient want to make changes to medical advance directive?  No - Patient declined    No -  Patient declined No - Guardian declined  Copy of Healthcare Power of Attorney in Chart? No - copy requested No - copy requested  No - copy requested No - copy requested  No - copy requested    Current Medications (verified) Outpatient Encounter Medications as of 02/22/2024  Medication Sig   atorvastatin (LIPITOR) 80 MG tablet Take 1 tablet (80 mg total) by mouth daily.   bimatoprost (LUMIGAN) 0.03 % ophthalmic solution Place 1 drop into the right eye at bedtime.   brimonidine (ALPHAGAN) 0.2 % ophthalmic solution Place 1 drop into the right eye 2 (two) times daily.   Cholecalciferol (VITAMIN D3) 50 MCG (2000 UT) capsule Take 1 capsule (2,000 Units total) by mouth daily.   finasteride (PROSCAR) 5 MG tablet Take 1 tablet (5 mg total) by mouth daily.   losartan (COZAAR) 100 MG tablet Take 1 tablet (100 mg total) by mouth daily.   omeprazole (PRILOSEC) 20 MG capsule TAKE 1 CAPSULE BY MOUTH EVERY DAY   Polyethyl Glycol-Propyl Glycol (LUBRICATING EYE DROPS OP) Place 1 drop into both eyes daily as needed (dry eyes/burn).   Psyllium (METAMUCIL PO) Take 1 Dose by mouth every other day. 1 dose = 1 tablespoon   tamsulosin (FLOMAX) 0.4 MG CAPS capsule TAKE 1 CAPSULE BY MOUTH EVERY DAY   traMADol (ULTRAM) 50 MG tablet Take 1 tablet (50 mg total) by mouth every 6 (six) hours as needed for moderate pain.   No facility-administered encounter medications on file as of 02/22/2024.    Allergies (verified) Lisinopril   History: Past Medical History:  Diagnosis Date   BPH (benign prostatic  hypertrophy) 11/16/1994   Cataract    removed bilat    Diverticulosis    GERD (gastroesophageal reflux disease)    Glaucoma    Hemorrhoids    Hyperlipemia 04/17/1995   Hypertension 11/16/1994   Past Surgical History:  Procedure Laterality Date   CATARACT EXTRACTION W/PHACO Right 03/19/2020   Procedure: CATARACT EXTRACTION PHACO AND INTRAOCULAR LENS PLACEMENT (IOC) RIGHT VIVITY LENS 10.47  00:57.9;  Surgeon:  Galen Manila, MD;  Location: [facility];  Service: Ophthalmology;  Laterality: Right;   CATARACT EXTRACTION W/PHACO Left 04/16/2020   Procedure: CATARACT EXTRACTION PHACO AND INTRAOCULAR LENS PLACEMENT (IOC) LEFT VIVITY TORIC LENS;  Surgeon: Galen Manila, MD;  Location: [facility];  Service: Ophthalmology;  Laterality: Left;  5.36 0:32.3   COLONOSCOPY     x 2   PARATHYROIDECTOMY Right 06/14/2023   Procedure: RIGHT INFERIOR PARATHYROIDECTOMY;  Surgeon: Darnell Level, MD;  Location: [facility];  Service: General;  Laterality: Right;   POLYPECTOMY     Family History  Problem Relation Age of Onset   Cancer Mother        breast CA Mets, recurrent   Breast cancer Mother    Hypertension Father    Cancer Father        prostate cancer   Heart disease Father        MI,CABG   Aneurysm Father    Prostate cancer Father    Stroke Father    Cancer Sister        breast ca   Breast cancer Sister    Cancer Sister        breast cancer   Breast cancer Sister    Colon cancer Neg Hx    Colon polyps Neg Hx    Esophageal cancer Neg Hx    Rectal cancer Neg Hx    Stomach cancer Neg Hx    Social History   Socioeconomic History   Marital status: Married    Spouse name: Not on file   Number of children: 2   Years of education: Not on file   Highest education level: Not on file  Occupational History   Occupation: Environmental manager  Tobacco Use   Smoking status: Never   Smokeless tobacco: Never  Vaping Use   Vaping status: Never Used  Substance and Sexual Activity   Alcohol use: Yes    Alcohol/week: 2.0 standard drinks of alcohol    Types: 2 Shots of liquor per week    Comment: 2 drinks a day   Drug use: No   Sexual activity: Yes  Other Topics Concern   Not on file  Social History Narrative   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     [employer] grad   Social Drivers of Health   Financial Resource Strain: Low Risk  (02/22/2024)   Overall Financial  Resource Strain (CARDIA)    Difficulty of Paying Living Expenses: Not hard at all  Food Insecurity: No Food Insecurity (02/22/2024)   Hunger Vital Sign    Worried About Running Out of Food in the Last Year: Never true    Ran Out of Food in the Last Year: Never true  Transportation Needs: No Transportation Needs (02/22/2024)   PRAPARE - Administrator, Civil Service (Medical): No    Lack of Transportation (Non-Medical): No  Physical Activity: Inactive (02/22/2024)   Exercise Vital Sign    Days of Exercise per Week: 0 days  Minutes of Exercise per Session: 0 min  Stress: No Stress Concern Present (02/22/2024)   [employer] of Occupational Health - Occupational Stress Questionnaire    Feeling of Stress : Not at all  Social Connections: Socially Integrated (02/22/2024)   Social Connection and Isolation Panel [NHANES]    Frequency of Communication with Friends and Family: More than three times a week    Frequency of Social Gatherings with Friends and Family: More than three times a week    Attends Religious Services: More than 4 times per year    Active Member of [employer] or Organizations: Yes    Attends Engineer, structural: More than 4 times per year    Marital Status: Married    Tobacco Counseling Counseling given: Not Answered    Clinical Intake:  Pre-visit preparation completed: Yes  Pain : No/denies pain     BMI - recorded: 28.17 Nutritional Status: BMI 25 -29 Overweight Nutritional Risks: None Diabetes: No  Lab Results  Component Value Date   HGBA1C 6.6 (H) 02/11/2024   HGBA1C 6.3 01/29/2023   HGBA1C 6.3 01/27/2022     How often do you need to have someone help you when you read instructions, pamphlets, or other written materials from your doctor or pharmacy?: 1 - Never  Interpreter Needed?: No  Comments: lives with wife Information entered by :: B.Coral Timme,LPN   Activities of Daily Living     02/22/2024    2:04 PM 06/11/2023   11:03 AM   In your present state of health, do you have any difficulty performing the following activities:  Hearing? 0   Vision? 0   Difficulty concentrating or making decisions? 0   Walking or climbing stairs? 0   Dressing or bathing? 0   Doing errands, shopping? 0 0  Preparing Food and eating ? N   Using the Toilet? N   In the past six months, have you accidently leaked urine? N   Do you have problems with loss of bowel control? N   Managing your Medications? N   Managing your Finances? N   Housekeeping or managing your Housekeeping? N     Patient Care Team: Joaquim Nam, MD as PCP - General (Family Medicine)  Indicate any recent Medical Services you may have received from other than [facility] providers in the past year (date may be approximate).     Assessment:   This is a routine wellness examination for [place].  Hearing/Vision screen Hearing Screening - Comments:: Pt says his hearing is fine Vision Screening - Comments:: Pt says his vision is good;glaucoma visits Q36mo Dr Druscilla Brownie   Goals Addressed             This Visit's Progress    COMPLETED: Increase water intake   On track    Starting 10/14/2016, I will attempt to drink at least 8 oz water with each meal daily.      Patient Stated   On track    02/22/24- I will maintain and continue medications as prescribed.      COMPLETED: Patient Stated   Not on track    01/29/2021, I will continue to walk 3-4 days for about 20 minutes     Patient Stated   On track    02/22/24-Lose weight        Depression Screen     02/22/2024    1:58 PM 02/08/2023   10:32 AM 02/04/2023   10:16 AM 02/02/2022   10:33 AM 01/29/2021  3:35 PM 10/28/2020    2:49 PM 11/28/2019    2:44 PM  PHQ 2/9 Scores  PHQ - 2 Score 0 1 0 0 0 0 0  PHQ- 9 Score  2   0  0    Fall Risk     02/22/2024    1:55 PM 02/08/2023   10:31 AM 02/04/2023   10:19 AM 02/02/2022   10:32 AM 01/29/2021    3:35 PM  Fall Risk   Falls in the past year? 0 0 0 0 0  Number falls in  past yr: 0 0 0 0 0  Injury with Fall? 0 0 0 0 0  Risk for fall due to : No Fall Risks No Fall Risks Impaired vision History of fall(s) Medication side effect  Follow up Education provided;Falls prevention discussed Falls evaluation completed Falls prevention discussed Falls evaluation completed Falls evaluation completed;Falls prevention discussed    MEDICARE RISK AT HOME:  Medicare Risk at Home Any stairs in or around the home?: Yes If so, are there any without handrails?: Yes Home free of loose throw rugs in walkways, pet beds, electrical cords, etc?: Yes Adequate lighting in your home to reduce risk of falls?: Yes Life alert?: No Use of a cane, walker or w/c?: No Grab bars in the bathroom?: No Shower chair or bench in shower?: Yes (built in) Elevated toilet seat or a handicapped toilet?: Yes  TIMED UP AND GO:  Was the test performed?  No  Cognitive Function: 6CIT completed    01/29/2021    3:36 PM 11/28/2019    2:53 PM 10/14/2016    9:58 AM  MMSE - Mini Mental State Exam  Not completed: Refused    Orientation to time  5 5  Orientation to Place  5 5  Registration  3 3  Attention/ Calculation  5 0  Recall  3 3  Language- name 2 objects   0  Language- repeat  1 1  Language- follow 3 step command   3  Language- read & follow direction   0  Write a sentence   0  Copy design   0  Total score   20        02/22/2024    2:00 PM  6CIT Screen  What Year? 0 points  What month? 0 points  What time? 0 points  Count back from 20 0 points  Months in reverse 4 points  Repeat phrase 0 points  Total Score 4 points    Immunizations Immunization History  Administered Date(s) Administered   Fluad Quad(high Dose 65+) 12/04/2023   Influenza Whole 10/23/2009   Influenza,inj,Quad PF,6+ Mos 08/18/2013, 10/01/2014, 10/14/2015, 10/14/2016, 08/04/2018   Influenza-Unspecified 08/30/2017, 09/09/2019, 08/15/2020   PFIZER Comirnaty(Gray Top)Covid-19 Tri-Sucrose Vaccine 12/21/2019,  01/11/2020   PFIZER(Purple Top)SARS-COV-2 Vaccination 10/07/2020   Pneumococcal Conjugate-13 10/01/2014   Pneumococcal Polysaccharide-23 08/18/2013   Td 02/15/1995, 11/11/2005   Tdap 09/15/2017   Zoster, Live 04/02/2008    Screening Tests Health Maintenance  Topic Date Due   Zoster Vaccines- Shingrix (1 of 2) 09/25/1966   INFLUENZA VACCINE  06/16/2024   Medicare Annual Wellness (AWV)  02/21/2025   DTaP/Tdap/Td (4 - Td or Tdap) 09/16/2027   Pneumonia Vaccine 3+ Years old  Completed   Hepatitis C Screening  Completed   HPV VACCINES  Aged Out   Colonoscopy  Discontinued   COVID-19 Vaccine  Discontinued    Health Maintenance  Health Maintenance Due  Topic Date Due   Zoster Vaccines-  Shingrix (1 of 2) 09/25/1966   Health Maintenance Items Addressed: None needed  Additional Screening: Pt advised he may get Shingles vaccine at pharmacy if he decides he wants vaccine.  Vision Screening: Recommended annual ophthalmology exams for early detection of glaucoma and other disorders of the eye.  Dental Screening: Recommended annual dental exams for proper oral hygiene  Community Resource Referral / Chronic Care Management: CRR required this visit?  No   CCM required this visit?  No     Plan:     I have personally reviewed and noted the following in the patient's chart:   Medical and social history Use of alcohol, tobacco or illicit drugs  Current medications and supplements including opioid prescriptions. Patient is not currently taking opioid prescriptions. Functional ability and status Nutritional status Physical activity Advanced directives List of other physicians Hospitalizations, surgeries, and ER visits in previous 12 months Vitals Screenings to include cognitive, depression, and falls Referrals and appointments  In addition, I have reviewed and discussed with patient certain preventive protocols, quality metrics, and best practice recommendations. A written  personalized care plan for preventive services as well as general preventive health recommendations were provided to patient.     Sue Lush, LPN   11/21/1094   After Visit Summary: (MyChart) Due to this being a telephonic visit, the after visit summary with patients personalized plan was offered to patient via MyChart   Notes: Nothing significant to report at this time.

## 2024-02-25 ENCOUNTER — Encounter: Payer: Self-pay | Admitting: Family Medicine

## 2024-02-25 ENCOUNTER — Ambulatory Visit (INDEPENDENT_AMBULATORY_CARE_PROVIDER_SITE_OTHER): Payer: PPO | Admitting: Family Medicine

## 2024-02-25 VITALS — BP 132/74 | HR 59 | Temp 98.9°F | Ht 70.71 in | Wt 217.6 lb

## 2024-02-25 DIAGNOSIS — I10 Essential (primary) hypertension: Secondary | ICD-10-CM

## 2024-02-25 DIAGNOSIS — E119 Type 2 diabetes mellitus without complications: Secondary | ICD-10-CM

## 2024-02-25 DIAGNOSIS — Z7189 Other specified counseling: Secondary | ICD-10-CM

## 2024-02-25 DIAGNOSIS — Z0000 Encounter for general adult medical examination without abnormal findings: Secondary | ICD-10-CM

## 2024-02-25 DIAGNOSIS — E785 Hyperlipidemia, unspecified: Secondary | ICD-10-CM

## 2024-02-25 DIAGNOSIS — N400 Enlarged prostate without lower urinary tract symptoms: Secondary | ICD-10-CM

## 2024-02-25 DIAGNOSIS — E210 Primary hyperparathyroidism: Secondary | ICD-10-CM

## 2024-02-25 MED ORDER — TAMSULOSIN HCL 0.4 MG PO CAPS: 0.4000 mg | ORAL_CAPSULE | Freq: Every day | ORAL | 3 refills | Status: AC

## 2024-02-25 MED ORDER — OMEPRAZOLE 20 MG PO CPDR: 20.0000 mg | DELAYED_RELEASE_CAPSULE | Freq: Every day | ORAL | 3 refills | Status: AC

## 2024-02-25 MED ORDER — LOSARTAN POTASSIUM 100 MG PO TABS: 100.0000 mg | ORAL_TABLET | Freq: Every day | ORAL | 3 refills | Status: DC

## 2024-02-25 MED ORDER — FINASTERIDE 5 MG PO TABS
5.0000 mg | ORAL_TABLET | Freq: Every day | ORAL | 3 refills | Status: AC
Start: 2024-02-25 — End: ?

## 2024-02-25 MED ORDER — ATORVASTATIN CALCIUM 80 MG PO TABS: 80.0000 mg | ORAL_TABLET | Freq: Every day | ORAL | 3 refills | Status: AC

## 2024-02-25 NOTE — Progress Notes (Signed)
 Hypertension:    Using medication without problems or lightheadedness: yes Chest pain with exertion:no Edema:no Short of breath:no  Elevated Cholesterol: Using medications without problems: yes Muscle aches: no Diet compliance: d/w pt.  Exercise: d/w pt.  Labs d/w pt.    Diabetes:  No meds.  Hypoglycemic episodes: no sx Hyperglycemic episodes: no sx Feet problems: no Blood Sugars averaging: not checked.   eye exam within last year: yes Diet d/w pt.    BPH/LUTS.  Sx better on finasteride and flomax.    He had parathyroid surgery per Dr. Sofia Dunn.  PTH and calcium wnl.    Vaccines d/w pt.  D/w pt about shingrix.   RSV vaccine d/w pt.   Colonoscopy 2022  PSA pending 2025 Hep C screening - completed- neg d/w pt.   AAA screening prev neg.   Advance directive- wife designated if patient were incapacitated.   PMH and SH reviewed  Meds, vitals, and allergies reviewed.   ROS: Per HPI unless specifically indicated in ROS section   GEN: nad, alert and oriented HEENT: ncat NECK: supple w/o LA CV: rrr. PULM: ctab, no inc wob ABD: soft, +bs EXT: no edema SKIN: no acute rash

## 2024-02-25 NOTE — Patient Instructions (Addendum)
 Recheck PSA and A1c at a nonfasting lab visit in about 3 months.   Update me as needed.  Low sugar diet in the meantime.  Take care.  Glad to see you.

## 2024-02-27 DIAGNOSIS — E119 Type 2 diabetes mellitus without complications: Secondary | ICD-10-CM | POA: Insufficient documentation

## 2024-02-27 NOTE — Assessment & Plan Note (Signed)
 Discussed labs and diet and exercise.  He is going to work on diet and exercise in the meantime and we can recheck his A1c later this year.  We deferred some diabetic health maintenance items because hopefully he will not be diabetic and will follow-up.

## 2024-02-27 NOTE — Assessment & Plan Note (Signed)
Continue atorvastatin.  Continue work on diet and exercise. 

## 2024-02-27 NOTE — Assessment & Plan Note (Signed)
 Advance directive- wife designated if patient were incapacitated.

## 2024-02-27 NOTE — Assessment & Plan Note (Signed)
 Sx better on finasteride and flomax.  Continue as is. We can check PSA with next set of labs.  I did not order it with his most recent set and I apologized to him about that.  He was gracious about that.

## 2024-02-27 NOTE — Assessment & Plan Note (Signed)
 Vaccines d/w pt.  D/w pt about shingrix.   RSV vaccine d/w pt.   Colonoscopy 2022  PSA pending 2025 Hep C screening - completed- neg d/w pt.   AAA screening prev neg.   Advance directive- wife designated if patient were incapacitated.

## 2024-02-27 NOTE — Assessment & Plan Note (Signed)
 History of, resolved. PTH and calcium wnl.

## 2024-02-27 NOTE — Assessment & Plan Note (Signed)
Continue losartan.  Continue work on diet and exercise. 

## 2024-09-04 DIAGNOSIS — H401112 Primary open-angle glaucoma, right eye, moderate stage: Secondary | ICD-10-CM | POA: Diagnosis not present

## 2024-09-04 DIAGNOSIS — Z961 Presence of intraocular lens: Secondary | ICD-10-CM | POA: Diagnosis not present

## 2024-12-02 ENCOUNTER — Other Ambulatory Visit: Payer: Self-pay | Admitting: Family Medicine

## 2024-12-02 DIAGNOSIS — E210 Primary hyperparathyroidism: Secondary | ICD-10-CM

## 2024-12-04 NOTE — Telephone Encounter (Signed)
 E-scribed refill.  Pls schedule annual exam and fasting labs, after 02/24/25, for additional refills.

## 2024-12-22 NOTE — Telephone Encounter (Signed)
 Called and schedule pt for Cpe  /  fasting labs

## 2025-02-19 ENCOUNTER — Other Ambulatory Visit

## 2025-02-22 ENCOUNTER — Ambulatory Visit

## 2025-02-23 ENCOUNTER — Ambulatory Visit

## 2025-02-26 ENCOUNTER — Encounter: Admitting: Family Medicine
